# Patient Record
Sex: Female | Born: 1995
Health system: Southern US, Community
[De-identification: ages and names within clinical notes are randomized; demographics above are authoritative.]

---
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
## 2017-02-14 ENCOUNTER — Emergency Department: Payer: 59

## 2017-02-14 ENCOUNTER — Emergency Department
Admission: EM | Admit: 2017-02-14 | Discharge: 2017-02-14 | Disposition: A | Discharge status: Home / Self Care | Payer: 59 | Attending: Emergency Medicine | Admitting: Emergency Medicine

## 2017-02-14 ENCOUNTER — Encounter: Payer: Self-pay | Admitting: Emergency Medicine

## 2017-02-14 DIAGNOSIS — R1030 Lower abdominal pain, unspecified: Secondary | ICD-10-CM

## 2017-02-14 DIAGNOSIS — R109 Unspecified abdominal pain: Secondary | ICD-10-CM

## 2017-02-14 DIAGNOSIS — N72 Inflammatory disease of cervix uteri: Secondary | ICD-10-CM | POA: Insufficient documentation

## 2017-02-14 DIAGNOSIS — R1031 Right lower quadrant pain: Secondary | ICD-10-CM | POA: Diagnosis present

## 2017-02-14 LAB — URINALYSIS, COMPLETE (UACMP) WITH MICROSCOPIC
Bacteria, UA: NONE SEEN
Bilirubin Urine: NEGATIVE
GLUCOSE, UA: NEGATIVE mg/dL
HGB URINE DIPSTICK: NEGATIVE
Ketones, ur: NEGATIVE mg/dL
NITRITE: NEGATIVE
Protein, ur: NEGATIVE mg/dL
SPECIFIC GRAVITY, URINE: 1.019 (ref 1.005–1.030)
pH: 6 (ref 5.0–8.0)

## 2017-02-14 LAB — COMPREHENSIVE METABOLIC PANEL
ALBUMIN: 4.9 g/dL (ref 3.5–5.0)
ALT: 12 U/L — ABNORMAL LOW (ref 14–54)
ANION GAP: 10 (ref 5–15)
AST: 20 U/L (ref 15–41)
Alkaline Phosphatase: 57 U/L (ref 38–126)
BUN: 9 mg/dL (ref 6–20)
CHLORIDE: 105 mmol/L (ref 101–111)
CO2: 24 mmol/L (ref 22–32)
Calcium: 9.6 mg/dL (ref 8.9–10.3)
Creatinine, Ser: 0.67 mg/dL (ref 0.44–1.00)
GFR calc Af Amer: >60 mL/min (ref >60)
GFR calc non Af Amer: >60 mL/min (ref >60)
GLUCOSE: 86 mg/dL (ref 65–99)
POTASSIUM: 3.3 mmol/L — AB (ref 3.5–5.1)
Sodium: 139 mmol/L (ref 135–145)
Total Bilirubin: 2.4 mg/dL — ABNORMAL HIGH (ref 0.3–1.2)
Total Protein: 8.4 g/dL — ABNORMAL HIGH (ref 6.5–8.1)

## 2017-02-14 LAB — CBC
HCT: 39.3 % (ref 35.0–47.0)
HEMOGLOBIN: 13.5 g/dL (ref 12.0–16.0)
MCH: 30.9 pg (ref 26.0–34.0)
MCHC: 34.3 g/dL (ref 32.0–36.0)
MCV: 90.2 fL (ref 80.0–100.0)
Platelets: 260 10*3/uL (ref 150–440)
RBC: 4.36 MIL/uL (ref 3.80–5.20)
RDW: 12.9 % (ref 11.5–14.5)
WBC: 12.9 10*3/uL — AB (ref 3.6–11.0)

## 2017-02-14 LAB — LIPASE, BLOOD: LIPASE: 21 U/L (ref 11–51)

## 2017-02-14 LAB — WET PREP, GENITAL
CLUE CELLS WET PREP: NONE SEEN
SPERM: NONE SEEN
Trich, Wet Prep: NONE SEEN
Yeast Wet Prep HPF POC: NONE SEEN

## 2017-02-14 LAB — CHLAMYDIA/NGC RT PCR (ARMC ONLY)
CHLAMYDIA TR: NOT DETECTED
N gonorrhoeae: NOT DETECTED

## 2017-02-14 LAB — PREGNANCY, URINE: Preg Test, Ur: NEGATIVE

## 2017-02-14 MED ORDER — TRAMADOL HCL 50 MG PO TABS: 50.0000 mg | ORAL_TABLET | Freq: Four times a day (QID) | ORAL | 0 refills | Status: DC | PRN

## 2017-02-14 MED ORDER — DOXYCYCLINE HYCLATE 100 MG PO TABS: 100.0000 mg | ORAL_TABLET | Freq: Two times a day (BID) | ORAL | 0 refills | Status: DC

## 2017-02-14 MED ORDER — CEFTRIAXONE SODIUM 250 MG IJ SOLR
250.0000 mg | Freq: Once | INTRAMUSCULAR | Status: AC
  Administered 2017-02-14: 250 mg via INTRAMUSCULAR
  Filled 2017-02-14: qty 250

## 2017-02-14 MED ORDER — ONDANSETRON 4 MG PO TBDP
4.0000 mg | ORAL_TABLET | Freq: Once | ORAL | Status: AC
  Administered 2017-02-14: 4 mg via ORAL
  Filled 2017-02-14: qty 1

## 2017-02-14 MED ORDER — OXYCODONE-ACETAMINOPHEN 5-325 MG PO TABS
1.0000 | ORAL_TABLET | Freq: Once | ORAL | Status: AC
  Administered 2017-02-14: 1 via ORAL
  Filled 2017-02-14: qty 1

## 2017-02-14 MED ORDER — DOXYCYCLINE HYCLATE 100 MG PO TABS
100.0000 mg | ORAL_TABLET | Freq: Once | ORAL | Status: AC
  Administered 2017-02-14: 100 mg via ORAL
  Filled 2017-02-14: qty 1

## 2017-02-14 NOTE — ED Notes (Signed)
Report from iris, rn. 

## 2017-02-14 NOTE — ED Provider Notes (Signed)
[facility] Emergency Department Provider Note   ____________________________________________   First MD Initiated Contact with Patient 02/14/17 [phone]     (approximate)  I have reviewed the triage vital signs and the nursing notes.   HISTORY  Chief Complaint Abdominal Pain    HPI Virginia Hicks is a 21 y.o. female who comes into the hospital today with abdominal pain. The patient reports that the pain started today. She states it is in her lower abdomen but she also has some mild pain in her upper abdomen. The patient has not taken anything for pain and rates her pain a 7 out of 10 in intensity currently. She reports that she has no pain with urination but she does have some vaginal discharge. She reports it is whitish with no odor. The patient's last menstrual period was around March 15. The patient has had some nausea with no vomiting and some mild diarrhea. She's never had these symptoms before and denies any fevers at home. The patient is here today for evaluation of her symptoms.   History reviewed. No pertinent past medical history.  There are no active problems to display for this patient.   History reviewed. No pertinent surgical history.  Prior to Admission medications   Medication Sig Start Date End Date Taking? Authorizing Provider  doxycycline (VIBRA-TABS) 100 MG tablet Take 1 tablet (100 mg total) by mouth 2 (two) times daily. 02/14/17   Rebecka Apley, MD  traMADol (ULTRAM) 50 MG tablet Take 1 tablet (50 mg total) by mouth every 6 (six) hours as needed. 02/14/17   Rebecka Apley, MD    Allergies Patient has no known allergies.  History reviewed. No pertinent family history.  Social History Social History  Substance Use Topics  . Smoking status: Never Smoker  . Smokeless tobacco: Never Used  . Alcohol use No    Review of Systems Constitutional: No fever/chills Eyes: No visual changes. ENT: No sore throat. Cardiovascular:  Denies chest pain. Respiratory: Denies shortness of breath. Gastrointestinal:  abdominal pain,  nausea, and diarrhea, no vomiting. No constipation. Genitourinary: Negative for dysuria. Musculoskeletal: Negative for back pain. Skin: Negative for rash. Neurological: Negative for headaches, focal weakness or numbness.  10-point ROS otherwise negative.  ____________________________________________   PHYSICAL EXAM:  VITAL SIGNS: ED Triage Vitals  Enc Vitals Group     BP 02/14/17 0143 133/83     Pulse Rate 02/14/17 0143 77     Resp 02/14/17 0143 18     Temp 02/14/17 0143 98.4 F (36.9 C)     Temp Source 02/14/17 0143 Oral     SpO2 02/14/17 0143 100 %     Weight 02/14/17 0144 112 lb (50.8 kg)     Height 02/14/17 0144  (1.727 m)     Head Circumference --      Peak Flow --      Pain Score 02/14/17 0142 7     Pain Loc --      Pain Edu? --      Excl. in GC? --     Constitutional: Alert and oriented. Well appearing and in moderate distress. Eyes: Conjunctivae are normal. PERRL. EOMI. Head: Atraumatic. Nose: No congestion/rhinnorhea. Mouth/Throat: Mucous membranes are moist.  Oropharynx non-erythematous. Cardiovascular: Normal rate, regular rhythm. Grossly normal heart sounds.  Good peripheral circulation. Respiratory: Normal respiratory effort.  No retractions. Lungs CTAB. Gastrointestinal: Soft with some RLQ abd pain, No distention. Positive bowel sounds Genitourinary: Normal external genitalia, cervical motion tenderness  with some right-sided and uterine tenderness to palpation. Musculoskeletal: No lower extremity tenderness nor edema.   Neurologic:  Normal speech and language.  Skin:  Skin is warm, dry and intact.  Psychiatric: Mood and affect are normal.   ____________________________________________   LABS (all labs ordered are listed, but only abnormal results are displayed)  Labs Reviewed  WET PREP, GENITAL - Abnormal; Notable for the following:       Result  Value   WBC, Wet Prep HPF POC MANY (*)    All other components within normal limits  COMPREHENSIVE METABOLIC PANEL - Abnormal; Notable for the following:    Potassium 3.3 (*)    Total Protein 8.4 (*)    ALT 12 (*)    Total Bilirubin 2.4 (*)    All other components within normal limits  CBC - Abnormal; Notable for the following:    WBC 12.9 (*)    All other components within normal limits  URINALYSIS, COMPLETE (UACMP) WITH MICROSCOPIC - Abnormal; Notable for the following:    Color, Urine YELLOW (*)    APPearance HAZY (*)    Leukocytes, UA SMALL (*)    Squamous Epithelial / LPF 0-5 (*)    All other components within normal limits  CHLAMYDIA/NGC RT PCR ([facility] ONLY)  LIPASE, BLOOD  PREGNANCY, URINE  POC URINE PREG, ED   ____________________________________________  EKG  none ____________________________________________  RADIOLOGY  US pelvis  ____________________________________________   PROCEDURES  Procedure(s) performed: None  Procedures  Critical Care performed: No  ____________________________________________   INITIAL IMPRESSION / ASSESSMENT AND PLAN / ED COURSE  Pertinent labs & imaging results that were available during my care of the patient were reviewed by me and considered in my medical decision making (see chart for details).  This is a 21 year old who comes into the hospital today with abdominal pain. I will perform a pelvic exam as well as in the patient for an ultrasound to look at her ovaries. I will give the patient a dose of Percocet and I will reassess the patient.  Clinical Course as of Feb 15 632  Mon Feb 14, 2017  0604 Normal examination. No evidence of abnormal ovarian mass or torsion. US Pelvis Complete [AW]    Clinical Course User Index [AW] Rebecka Apley, MD   The patient has been resting comfortably in her room. She received her ultrasound it was negative. The patient though did have some cervical motion tenderness on  exam. I feel that the patient may have some cervicitis causing some of her discomfort. I will give the patient some ceftriaxone and doxycycline. I did discuss with the patient that we did not evaluate her appendix but she has not had any fevers, vomiting, worsening pain, or decreased appetite. I informed the patient that should the pain come back should be persistent over the next 24-48 hours she should return to the emergency department for evaluation of her appendix. The patient reports that she understands. She'll be discharged home.  ____________________________________________   FINAL CLINICAL IMPRESSION(S) / ED DIAGNOSES  Final diagnoses:  Abdominal pain  Lower abdominal pain  Cervicitis      NEW MEDICATIONS STARTED DURING THIS VISIT:  New Prescriptions   DOXYCYCLINE (VIBRA-TABS) 100 MG TABLET    Take 1 tablet (100 mg total) by mouth 2 (two) times daily.   TRAMADOL (ULTRAM) 50 MG TABLET    Take 1 tablet (50 mg total) by mouth every 6 (six) hours as needed.     Note:  This document was prepared using Dragon voice recognition software and may include unintentional dictation errors.    Rebecka Apley, MD 02/14/17 [phone]

## 2017-02-14 NOTE — ED Triage Notes (Signed)
Pt with father reports pain lower umbilicus since 1200 yesterday, sharp 7/10 pain with N/D and chills.  Pt denies dysuria but reports frequency.  Pt reports family history of ovarian cysts and irregular periods.

## 2017-02-14 NOTE — Discharge Instructions (Signed)
Please return with persistent or worsening pain. We did not evaluate your appendix at this visit but should the pain persist I would be concerned about her appendix. Otherwise please follow-up with the acute care clinic.

## 2017-02-15 ENCOUNTER — Encounter: Payer: Self-pay | Admitting: *Deleted

## 2017-02-15 DIAGNOSIS — R110 Nausea: Secondary | ICD-10-CM | POA: Insufficient documentation

## 2017-02-15 DIAGNOSIS — R1013 Epigastric pain: Secondary | ICD-10-CM | POA: Diagnosis not present

## 2017-02-15 DIAGNOSIS — R1011 Right upper quadrant pain: Secondary | ICD-10-CM | POA: Diagnosis present

## 2017-02-15 DIAGNOSIS — R1031 Right lower quadrant pain: Secondary | ICD-10-CM | POA: Diagnosis not present

## 2017-02-15 LAB — URINALYSIS, COMPLETE (UACMP) WITH MICROSCOPIC
BACTERIA UA: NONE SEEN
Bilirubin Urine: NEGATIVE
Glucose, UA: NEGATIVE mg/dL
Hgb urine dipstick: NEGATIVE
Ketones, ur: NEGATIVE mg/dL
Leukocytes, UA: NEGATIVE
NITRITE: NEGATIVE
Protein, ur: NEGATIVE mg/dL
SPECIFIC GRAVITY, URINE: 1.013 (ref 1.005–1.030)
pH: 5 (ref 5.0–8.0)

## 2017-02-15 LAB — COMPREHENSIVE METABOLIC PANEL
ALBUMIN: 4.1 g/dL (ref 3.5–5.0)
ALK PHOS: 50 U/L (ref 38–126)
ALT: 11 U/L — ABNORMAL LOW (ref 14–54)
ANION GAP: 6 (ref 5–15)
AST: 18 U/L (ref 15–41)
BUN: 9 mg/dL (ref 6–20)
CALCIUM: 8.8 mg/dL — AB (ref 8.9–10.3)
CO2: 26 mmol/L (ref 22–32)
Chloride: 104 mmol/L (ref 101–111)
Creatinine, Ser: 0.64 mg/dL (ref 0.44–1.00)
GFR calc Af Amer: >60 mL/min (ref >60)
GFR calc non Af Amer: >60 mL/min (ref >60)
GLUCOSE: 104 mg/dL — AB (ref 65–99)
Potassium: 4 mmol/L (ref 3.5–5.1)
SODIUM: 136 mmol/L (ref 135–145)
Total Bilirubin: 1.9 mg/dL — ABNORMAL HIGH (ref 0.3–1.2)
Total Protein: 7.1 g/dL (ref 6.5–8.1)

## 2017-02-15 LAB — CBC
HEMATOCRIT: 37.1 % (ref 35.0–47.0)
Hemoglobin: 12.6 g/dL (ref 12.0–16.0)
MCH: 31.2 pg (ref 26.0–34.0)
MCHC: 34.1 g/dL (ref 32.0–36.0)
MCV: 91.6 fL (ref 80.0–100.0)
Platelets: 243 10*3/uL (ref 150–440)
RBC: 4.05 MIL/uL (ref 3.80–5.20)
RDW: 12.8 % (ref 11.5–14.5)
WBC: 7.3 10*3/uL (ref 3.6–11.0)

## 2017-02-15 LAB — LIPASE, BLOOD: Lipase: 24 U/L (ref 11–51)

## 2017-02-15 LAB — POCT PREGNANCY, URINE: PREG TEST UR: NEGATIVE

## 2017-02-15 NOTE — ED Triage Notes (Signed)
Pt seen here on the 16th for abdominal pain, states pain (around the umbilicus, upper abdomen, and intermittent lower abdomen) still persist. Told to come back for eval if still ongoing. Pain meds "only dull the pain" slightly.

## 2017-02-15 NOTE — ED Triage Notes (Signed)
Denies any new symptoms

## 2017-02-16 ENCOUNTER — Ambulatory Visit (INDEPENDENT_AMBULATORY_CARE_PROVIDER_SITE_OTHER): Payer: 59 | Admitting: Gastroenterology

## 2017-02-16 ENCOUNTER — Encounter: Payer: Self-pay | Admitting: Radiology

## 2017-02-16 ENCOUNTER — Emergency Department: Payer: 59

## 2017-02-16 ENCOUNTER — Emergency Department
Admission: EM | Admit: 2017-02-16 | Discharge: 2017-02-16 | Disposition: A | Discharge status: Home / Self Care | Payer: 59 | Attending: Emergency Medicine | Admitting: Emergency Medicine

## 2017-02-16 ENCOUNTER — Encounter: Payer: Self-pay | Admitting: Gastroenterology

## 2017-02-16 VITALS — BP 99/66 | HR 68 | Temp 98.7°F | Resp 16 | Ht 68.0 in | Wt 108.0 lb

## 2017-02-16 DIAGNOSIS — R1013 Epigastric pain: Secondary | ICD-10-CM

## 2017-02-16 LAB — PREGNANCY, URINE: Preg Test, Ur: NEGATIVE

## 2017-02-16 MED ORDER — DICYCLOMINE HCL 10 MG PO CAPS: 10.0000 mg | ORAL_CAPSULE | Freq: Three times a day (TID) | ORAL | 0 refills | Status: AC

## 2017-02-16 MED ORDER — PANTOPRAZOLE SODIUM 40 MG PO TBEC: 40.0000 mg | DELAYED_RELEASE_TABLET | Freq: Every day | ORAL | 0 refills | Status: AC

## 2017-02-16 MED ORDER — IOPAMIDOL (ISOVUE-300) INJECTION 61%
30.0000 mL | INTRAVENOUS | Status: AC
Start: 2017-02-16 — End: 2017-02-16
  Administered 2017-02-16 (×2): 15 mL via ORAL

## 2017-02-16 MED ORDER — IOPAMIDOL (ISOVUE-300) INJECTION 61%
75.0000 mL | Freq: Once | INTRAVENOUS | Status: AC | PRN
  Administered 2017-02-16: 75 mL via INTRAVENOUS

## 2017-02-16 NOTE — ED Notes (Signed)
Patient finished with oral contrast. CT dept notified. No other needs at this time.

## 2017-02-16 NOTE — ED Notes (Addendum)
Patient ambulatory to triage with steady gait, without difficulty or distress noted; pt updated on wait time--voices good understanding; vs retaken

## 2017-02-16 NOTE — ED Provider Notes (Signed)
[facility] Emergency Department Provider Note    First MD Initiated Contact with Patient 02/16/17 0138     (approximate)  I have reviewed the triage vital signs and the nursing notes.   HISTORY  Chief Complaint Abdominal Pain   HPI Virginia Hicks is a 21 y.o. female presents with epigastric/right upper quadrant abdominal pain accompanied by nausea. Patient denies any fever no vomiting no diarrhea no urinary symptoms no constipation. Patient states that symptoms are worse after eating. Of note patient was seen in the emergency department on 02/14/2017 at which time pelvic ultrasounds revealed no gross abnormality. Of note patient's bilirubin noted to be elevated 2.4 during previous visit bilirubin 1.9 today.   Past medical history None There are no active problems to display for this patient.   Past surgical history None  Prior to Admission medications   Medication Sig Start Date End Date Taking? Authorizing Provider  dicyclomine (BENTYL) 10 MG capsule Take 1 capsule (10 mg total) by mouth 4 (four) times daily -  before meals and at bedtime. 02/16/17   Wyline Mood, MD  doxycycline (VIBRA-TABS) 100 MG tablet Take 1 tablet (100 mg total) by mouth 2 (two) times daily. Patient not taking: Reported on 02/16/2017 02/14/17   Rebecka Apley, MD  pantoprazole (PROTONIX) 40 MG tablet Take 1 tablet (40 mg total) by mouth daily. 02/16/17 02/16/18  Darci Current, MD  traMADol (ULTRAM) 50 MG tablet Take 1 tablet (50 mg total) by mouth every 6 (six) hours as needed. Patient not taking: Reported on 02/16/2017 02/14/17   Rebecka Apley, MD    Allergies No known drug allergies Family history Gallstones   Social History Social History  Substance Use Topics  . Smoking status: Never Smoker  . Smokeless tobacco: Never Used     Comment: vapors  . Alcohol use Yes     Comment: occasional    Review of Systems Constitutional: No fever/chills Eyes: No visual  changes. ENT: No sore throat. Cardiovascular: Denies chest pain. Respiratory: Denies shortness of breath. Gastrointestinal: Positive for abdominal pain and nausea  Genitourinary: Negative for dysuria. Musculoskeletal: Negative for back pain. Skin: Negative for rash. Neurological: Negative for headaches, focal weakness or numbness.  10-point ROS otherwise negative.  ____________________________________________   PHYSICAL EXAM:  VITAL SIGNS: ED Triage Vitals  Enc Vitals Group     BP 02/15/17 2142 117/70     Pulse Rate 02/15/17 2142 69     Resp 02/15/17 2142 16     Temp 02/15/17 2142 98.5 F (36.9 C)     Temp Source 02/15/17 2142 Oral     SpO2 02/15/17 2142 100 %     Weight 02/15/17 2143 112 lb (50.8 kg)     Height 02/15/17 2143  (1.727 m)     Head Circumference --      Peak Flow --      Pain Score 02/15/17 2142 4     Pain Loc --      Pain Edu? --      Excl. in GC? --     Constitutional: Alert and oriented. Well appearing and in no acute distress. Eyes: Conjunctivae are normal. PERRL. EOMI. Head: Atraumatic. Mouth/Throat: Mucous membranes are moist.  Oropharynx non-erythematous. Neck: No stridor.   Cardiovascular: Normal rate, regular rhythm. Good peripheral circulation. Grossly normal heart sounds. Respiratory: Normal respiratory effort.  No retractions. Lungs CTAB. Gastrointestinal: Right upper quadrant/right lower quadrant tenderness to palpation. No distention.  Musculoskeletal: No lower  extremity tenderness nor edema. No gross deformities of extremities. Neurologic:  Normal speech and language. No gross focal neurologic deficits are appreciated.  Skin:  Skin is warm, dry and intact. No rash noted. Psychiatric: Mood and affect are normal. Speech and behavior are normal.  ____________________________________________   LABS (all labs ordered are listed, but only abnormal results are displayed)  Labs Reviewed  COMPREHENSIVE METABOLIC PANEL - Abnormal;  Notable for the following:       Result Value   Glucose, Bld 104 (*)    Calcium 8.8 (*)    ALT 11 (*)    Total Bilirubin 1.9 (*)    All other components within normal limits  URINALYSIS, COMPLETE (UACMP) WITH MICROSCOPIC - Abnormal; Notable for the following:    Color, Urine YELLOW (*)    APPearance HAZY (*)    Squamous Epithelial / LPF 6-30 (*)    All other components within normal limits  LIPASE, BLOOD  CBC  PREGNANCY, URINE  POC URINE PREG, ED  POCT PREGNANCY, URINE    RADIOLOGY I, [place] N BROWN, personally viewed and evaluated these images (plain radiographs) as part of my medical decision making, as well as reviewing the written report by the radiologist.  Ct Abdomen Pelvis W Contrast  Result Date: 02/16/2017 CLINICAL DATA:  Upper abdominal, umbilical, and lower abdominal pain. Patient was seen here on the sixteenth for same and told to return if pain still ongoing. Minimal response to pain medication. EXAM: CT ABDOMEN AND PELVIS WITH CONTRAST TECHNIQUE: Multidetector CT imaging of the abdomen and pelvis was performed using the standard protocol following bolus administration of intravenous contrast. CONTRAST:  75mL ISOVUE-300 IOPAMIDOL (ISOVUE-300) INJECTION 61% COMPARISON:  None. FINDINGS: Lower chest: The lung bases are clear. Hepatobiliary: No focal liver abnormality is seen. No gallstones, gallbladder wall thickening, or biliary dilatation. Pancreas: Unremarkable. No pancreatic ductal dilatation or surrounding inflammatory changes. Spleen: Normal in size without focal abnormality. Adrenals/Urinary Tract: Adrenal glands are unremarkable. Kidneys are normal, without renal calculi, focal lesion, or hydronephrosis. Bladder is unremarkable. Stomach/Bowel: Stomach is within normal limits. Appendix appears normal. No evidence of bowel wall thickening, distention, or inflammatory changes. Vascular/Lymphatic: No significant vascular findings are present. No enlarged abdominal or pelvic  lymph nodes. Reproductive: Uterus and bilateral adnexa are unremarkable. Other: No abdominal wall hernia or abnormality. No abdominopelvic ascites. Musculoskeletal: No destructive bone lesions. IMPRESSION: No acute process demonstrated in the abdomen or pelvis. No evidence of bowel obstruction or inflammation. Electronically Signed   By: Burman Nieves M.D.   On: 02/16/2017 04:34      Procedures   ____________________________________________   INITIAL IMPRESSION / ASSESSMENT AND PLAN / ED COURSE  Pertinent labs & imaging results that were available during my care of the patient were reviewed by me and considered in my medical decision making (see chart for details).  History of physical exam concerning for possible gallbladder pathology versus gastric ulcer disease. CT scan of the abdomen and pelvis performed secondary to additional right lower quadrant pain. No acute process was noted on the CT scan and a such patient will be referred to gastroenterology for further outpatient evaluation.      ____________________________________________  FINAL CLINICAL IMPRESSION(S) / ED DIAGNOSES  Abdominal pain  MEDICATIONS GIVEN DURING THIS VISIT:  Medications  iopamidol (ISOVUE-300) 61 % injection 30 mL (15 mLs Oral Contrast Given 02/16/17 0300)  iopamidol (ISOVUE-300) 61 % injection 75 mL (75 mLs Intravenous Contrast Given 02/16/17 0412)     NEW OUTPATIENT MEDICATIONS STARTED  DURING THIS VISIT:  Discharge Medication List as of 02/16/2017  5:53 AM    START taking these medications   Details  pantoprazole (PROTONIX) 40 MG tablet Take 1 tablet (40 mg total) by mouth daily., Starting Wed 02/16/2017, Until Thu 02/16/2018, Print        Discharge Medication List as of 02/16/2017  5:53 AM      Discharge Medication List as of 02/16/2017  5:53 AM       Note:  This document was prepared using Dragon voice recognition software and may include unintentional dictation errors.    Darci Current, MD 02/16/17 [phone]

## 2017-02-16 NOTE — Progress Notes (Signed)
Gastroenterology Consultation  Referring Provider: Dr Manson Passey    Primary Care Physician:  No PCP Per Patient Primary Gastroenterologist:  Dr. Wyline Mood  Reason for Consultation:     Abdominal pain         HPI:   Virginia Hicks is a 21 y.o. y/o female referred for consultation & management  by Dr. Bonnetta Barry PCP Per Patient.    She has been referred for epigastric pain. She was seen at the ER on 02/14/17 for lower and upper abdominal pain of 1 day duration . At the same time she did have some vaginal discharge. She was treated for what it appears PID with ceftriaxone and doxycycline. Dopplers evaluation of the ovaries were normal , she had a normal TV USG and pelvic USG.   She returned this morning to the ER with abdominal pain  Ct scan of the abdomen on 4 /18/2018-normal    Abdominal pain: Onset: began on Sunday - 4 days back . "little bit better", still hurts, on and off, occurs every every few minutes, pain lasts from minutes to few hours  Site :Center of the abdomen , some in the lower abdomen, since starting antibiotics the pain is better  Radiation: localized  Severity :presently 4/10  Ashby Dawes of pain: sharp  Aggravating factors: moving , eating and drinking , while she is eating  Relieving factors :sitting for a few minutes  Weight loss: yes a few lbs  NSAID use: none  PPI use :not yet on a PPI  Gall bladder surgery: still intact  Frequency of bowel movements: not every , once every 2-3 days, not hard  Change in bowel movements: none  Relief with bowel movements: no  Gas/Bloating/Abdominal distension: not much.   Still has vaginal discharge. No fevers   Smokes - uses a vape- many times a day , uses marijuana once a week .    No past medical history on file.  No past surgical history on file.  Prior to Admission medications   Medication Sig Start Date End Date Taking? Authorizing Provider  doxycycline (VIBRA-TABS) 100 MG tablet Take 1 tablet (100 mg total) by mouth 2 (two)  times daily. 02/14/17   Rebecka Apley, MD  pantoprazole (PROTONIX) 40 MG tablet Take 1 tablet (40 mg total) by mouth daily. 02/16/17 02/16/18  Darci Current, MD  traMADol (ULTRAM) 50 MG tablet Take 1 tablet (50 mg total) by mouth every 6 (six) hours as needed. 02/14/17   Rebecka Apley, MD    No family history on file.   Social History  Substance Use Topics  . Smoking status: Never Smoker  . Smokeless tobacco: Never Used  . Alcohol use No    Allergies as of 02/16/2017  . (No Known Allergies)    Review of Systems:    All systems reviewed and negative except where noted in HPI.   Physical Exam:  BP 99/66   Pulse 68   Temp 98.7 F (37.1 C)   Resp 16   Ht  (1.727 m)   Wt 108 lb (49 kg)   BMI 16.42 kg/m  No LMP recorded. Patient is not currently having periods (Reason: Irregular Periods). Psych:  Alert and cooperative. Normal mood and affect. General:   Alert,  Well-developed, well-nourished,thin in stature  pleasant and cooperative in NAD Head:  Normocephalic and atraumatic. Eyes:  Sclera clear, no icterus.   Conjunctiva pink. Ears:  Normal auditory acuity. Nose:  No deformity, discharge, or lesions. Mouth:  No deformity or lesions,oropharynx pink & moist. Neck:  Supple; no masses or thyromegaly. Lungs:  Respirations even and unlabored.  Clear throughout to auscultation.   No wheezes, crackles, or rhonchi. No acute distress. Heart:  Regular rate and rhythm; no murmurs, clicks, rubs, or gallops. Abdomen:  Normal bowel sounds.  No bruits.  Soft, non-tender and non-distended without masses, hepatosplenomegaly or hernias noted.  No guarding or rebound tenderness.    Msk:  Symmetrical without gross deformities. Good, equal movement & strength bilaterally. Neurologic:  Alert and oriented x3;  grossly normal neurologically. Skin:  Intact without significant lesions or rashes. No jaundice. Psych:  Alert and cooperative. Normal mood and affect.  Imaging Studies: US  Transvaginal Non-ob  Result Date: 02/14/2017 CLINICAL DATA:  Lower abdominal pain for 1 day EXAM: TRANSABDOMINAL AND TRANSVAGINAL ULTRASOUND OF PELVIS DOPPLER ULTRASOUND OF OVARIES TECHNIQUE: Both transabdominal and transvaginal ultrasound examinations of the pelvis were performed. Transabdominal technique was performed for global imaging of the pelvis including uterus, ovaries, adnexal regions, and pelvic cul-de-sac. It was necessary to proceed with endovaginal exam following the transabdominal exam to visualize the ovaries and endometrium. Color and duplex Doppler ultrasound was utilized to evaluate blood flow to the ovaries. COMPARISON:  None. FINDINGS: Uterus Measurements: 8 x 3.6 x 5.3 cm. Uterus is anteverted. No fibroids or other mass visualized. Endometrium Thickness: 8.3 mm.  No focal abnormality visualized. Right ovary Measurements: 5.1 x 2.4 x 2.5 cm. Hemorrhagic corpus luteum cyst demonstrated measuring 2.5 cm maximal diameter. No abnormal adnexal mass. Left ovary Measurements: 2.9 x 1.7 x 3 cm. Normal appearance/no adnexal mass. Pulsed Doppler evaluation of both ovaries demonstrates normal low-resistance arterial and venous waveforms. Other findings No abnormal free fluid. IMPRESSION: Normal examination. No evidence of abnormal ovarian mass or torsion. Electronically Signed   By: Burman Nieves M.D.   On: 02/14/2017 05:56   US Pelvis Complete  Result Date: 02/14/2017 CLINICAL DATA:  Lower abdominal pain for 1 day EXAM: TRANSABDOMINAL AND TRANSVAGINAL ULTRASOUND OF PELVIS DOPPLER ULTRASOUND OF OVARIES TECHNIQUE: Both transabdominal and transvaginal ultrasound examinations of the pelvis were performed. Transabdominal technique was performed for global imaging of the pelvis including uterus, ovaries, adnexal regions, and pelvic cul-de-sac. It was necessary to proceed with endovaginal exam following the transabdominal exam to visualize the ovaries and endometrium. Color and duplex Doppler ultrasound  was utilized to evaluate blood flow to the ovaries. COMPARISON:  None. FINDINGS: Uterus Measurements: 8 x 3.6 x 5.3 cm. Uterus is anteverted. No fibroids or other mass visualized. Endometrium Thickness: 8.3 mm.  No focal abnormality visualized. Right ovary Measurements: 5.1 x 2.4 x 2.5 cm. Hemorrhagic corpus luteum cyst demonstrated measuring 2.5 cm maximal diameter. No abnormal adnexal mass. Left ovary Measurements: 2.9 x 1.7 x 3 cm. Normal appearance/no adnexal mass. Pulsed Doppler evaluation of both ovaries demonstrates normal low-resistance arterial and venous waveforms. Other findings No abnormal free fluid. IMPRESSION: Normal examination. No evidence of abnormal ovarian mass or torsion. Electronically Signed   By: Burman Nieves M.D.   On: 02/14/2017 05:56   Ct Abdomen Pelvis W Contrast  Result Date: 02/16/2017 CLINICAL DATA:  Upper abdominal, umbilical, and lower abdominal pain. Patient was seen here on the sixteenth for same and told to return if pain still ongoing. Minimal response to pain medication. EXAM: CT ABDOMEN AND PELVIS WITH CONTRAST TECHNIQUE: Multidetector CT imaging of the abdomen and pelvis was performed using the standard protocol following bolus administration of intravenous contrast. CONTRAST:  75mL ISOVUE-300 IOPAMIDOL (ISOVUE-300) INJECTION 61%  COMPARISON:  None. FINDINGS: Lower chest: The lung bases are clear. Hepatobiliary: No focal liver abnormality is seen. No gallstones, gallbladder wall thickening, or biliary dilatation. Pancreas: Unremarkable. No pancreatic ductal dilatation or surrounding inflammatory changes. Spleen: Normal in size without focal abnormality. Adrenals/Urinary Tract: Adrenal glands are unremarkable. Kidneys are normal, without renal calculi, focal lesion, or hydronephrosis. Bladder is unremarkable. Stomach/Bowel: Stomach is within normal limits. Appendix appears normal. No evidence of bowel wall thickening, distention, or inflammatory changes.  Vascular/Lymphatic: No significant vascular findings are present. No enlarged abdominal or pelvic lymph nodes. Reproductive: Uterus and bilateral adnexa are unremarkable. Other: No abdominal wall hernia or abnormality. No abdominopelvic ascites. Musculoskeletal: No destructive bone lesions. IMPRESSION: No acute process demonstrated in the abdomen or pelvis. No evidence of bowel obstruction or inflammation. Electronically Signed   By: Burman Nieves M.D.   On: 02/16/2017 04:34   [place] Art/ven Flow Abd Pelv Doppler  Result Date: 02/14/2017 CLINICAL DATA:  Lower abdominal pain for 1 day EXAM: TRANSABDOMINAL AND TRANSVAGINAL ULTRASOUND OF PELVIS DOPPLER ULTRASOUND OF OVARIES TECHNIQUE: Both transabdominal and transvaginal ultrasound examinations of the pelvis were performed. Transabdominal technique was performed for global imaging of the pelvis including uterus, ovaries, adnexal regions, and pelvic cul-de-sac. It was necessary to proceed with endovaginal exam following the transabdominal exam to visualize the ovaries and endometrium. Color and duplex Doppler ultrasound was utilized to evaluate blood flow to the ovaries. COMPARISON:  None. FINDINGS: Uterus Measurements: 8 x 3.6 x 5.3 cm. Uterus is anteverted. No fibroids or other mass visualized. Endometrium Thickness: 8.3 mm.  No focal abnormality visualized. Right ovary Measurements: 5.1 x 2.4 x 2.5 cm. Hemorrhagic corpus luteum cyst demonstrated measuring 2.5 cm maximal diameter. No abnormal adnexal mass. Left ovary Measurements: 2.9 x 1.7 x 3 cm. Normal appearance/no adnexal mass. Pulsed Doppler evaluation of both ovaries demonstrates normal low-resistance arterial and venous waveforms. Other findings No abnormal free fluid. IMPRESSION: Normal examination. No evidence of abnormal ovarian mass or torsion. Electronically Signed   By: Burman Nieves M.D.   On: 02/14/2017 05:56    Assessment and Plan:   Virginia Hicks is a 21 y.o. y/o female has been referred  for abdominal pain of  Days duration. Presently on antibiotics for PID which she says is making her feel a bit better, She smokes vape and marijuana too . I suggested her to complete the course of antibiotics, commence on PPI and use bentyl as needed . If no better in 7-10 days then will plan to . Scan gall bladder and consider endoscopy if no better. I also strongly suggested to stop smoking marijuana and vape.    Follow up PRN  Dr Wyline Mood MD

## 2017-02-16 NOTE — ED Notes (Signed)
Patient returned from CT

## 2018-03-28 ENCOUNTER — Encounter: Payer: Self-pay | Admitting: Emergency Medicine

## 2018-03-28 ENCOUNTER — Other Ambulatory Visit: Payer: Self-pay

## 2018-03-28 ENCOUNTER — Emergency Department
Admission: EM | Admit: 2018-03-28 | Discharge: 2018-03-28 | Disposition: A | Discharge status: Home / Self Care | Payer: Managed Care, Other (non HMO) | Attending: Emergency Medicine | Admitting: Emergency Medicine

## 2018-03-28 ENCOUNTER — Emergency Department: Payer: Managed Care, Other (non HMO)

## 2018-03-28 DIAGNOSIS — R55 Syncope and collapse: Secondary | ICD-10-CM | POA: Diagnosis not present

## 2018-03-28 DIAGNOSIS — Z79899 Other long term (current) drug therapy: Secondary | ICD-10-CM | POA: Diagnosis not present

## 2018-03-28 DIAGNOSIS — N739 Female pelvic inflammatory disease, unspecified: Secondary | ICD-10-CM | POA: Diagnosis not present

## 2018-03-28 DIAGNOSIS — A749 Chlamydial infection, unspecified: Secondary | ICD-10-CM

## 2018-03-28 LAB — COMPREHENSIVE METABOLIC PANEL
ALK PHOS: 58 U/L (ref 38–126)
ALT: 15 U/L (ref 14–54)
ANION GAP: 11 (ref 5–15)
AST: 23 U/L (ref 15–41)
Albumin: 4 g/dL (ref 3.5–5.0)
BILIRUBIN TOTAL: 2.1 mg/dL — AB (ref 0.3–1.2)
BUN: 8 mg/dL (ref 6–20)
CALCIUM: 9.1 mg/dL (ref 8.9–10.3)
CO2: 22 mmol/L (ref 22–32)
Chloride: 104 mmol/L (ref 101–111)
Creatinine, Ser: 0.71 mg/dL (ref 0.44–1.00)
Glucose, Bld: 121 mg/dL — ABNORMAL HIGH (ref 65–99)
Potassium: 3.2 mmol/L — ABNORMAL LOW (ref 3.5–5.1)
Sodium: 137 mmol/L (ref 135–145)
TOTAL PROTEIN: 7.3 g/dL (ref 6.5–8.1)

## 2018-03-28 LAB — CBC
HCT: 37.7 % (ref 35.0–47.0)
HEMOGLOBIN: 13.1 g/dL (ref 12.0–16.0)
MCH: 32.9 pg (ref 26.0–34.0)
MCHC: 34.8 g/dL (ref 32.0–36.0)
MCV: 94.6 fL (ref 80.0–100.0)
Platelets: 239 10*3/uL (ref 150–440)
RBC: 3.99 MIL/uL (ref 3.80–5.20)
RDW: 13.1 % (ref 11.5–14.5)
WBC: 6.6 10*3/uL (ref 3.6–11.0)

## 2018-03-28 LAB — URINALYSIS, COMPLETE (UACMP) WITH MICROSCOPIC
BILIRUBIN URINE: NEGATIVE
Glucose, UA: NEGATIVE mg/dL
KETONES UR: NEGATIVE mg/dL
Nitrite: NEGATIVE
PH: 7 (ref 5.0–8.0)
Protein, ur: NEGATIVE mg/dL
Specific Gravity, Urine: 1.017 (ref 1.005–1.030)
WBC, UA: >50 WBC/hpf — ABNORMAL HIGH (ref 0–5)

## 2018-03-28 LAB — WET PREP, GENITAL
CLUE CELLS WET PREP: NONE SEEN
Sperm: NONE SEEN
TRICH WET PREP: NONE SEEN
YEAST WET PREP: NONE SEEN

## 2018-03-28 LAB — CHLAMYDIA/NGC RT PCR (ARMC ONLY)
Chlamydia Tr: DETECTED — AB
N GONORRHOEAE: NOT DETECTED

## 2018-03-28 LAB — POCT PREGNANCY, URINE: PREG TEST UR: NEGATIVE

## 2018-03-28 MED ORDER — METRONIDAZOLE 500 MG PO TABS: 500.0000 mg | ORAL_TABLET | Freq: Two times a day (BID) | ORAL | 0 refills | Status: AC

## 2018-03-28 MED ORDER — SODIUM CHLORIDE 0.9 % IV SOLN
1.0000 g | Freq: Once | INTRAVENOUS | Status: AC
  Administered 2018-03-28: 1 g via INTRAVENOUS
  Filled 2018-03-28: qty 10

## 2018-03-28 MED ORDER — POTASSIUM CHLORIDE CRYS ER 20 MEQ PO TBCR
40.0000 meq | EXTENDED_RELEASE_TABLET | Freq: Once | ORAL | Status: AC
  Administered 2018-03-28: 40 meq via ORAL
  Filled 2018-03-28: qty 2

## 2018-03-28 MED ORDER — SODIUM CHLORIDE 0.9 % IV BOLUS
1000.0000 mL | Freq: Once | INTRAVENOUS | Status: AC
  Administered 2018-03-28: 1000 mL via INTRAVENOUS

## 2018-03-28 MED ORDER — IBUPROFEN 600 MG PO TABS
600.0000 mg | ORAL_TABLET | ORAL | Status: AC
  Administered 2018-03-28: 600 mg via ORAL
  Filled 2018-03-28: qty 1

## 2018-03-28 MED ORDER — DOXYCYCLINE HYCLATE 100 MG PO CAPS: 100.0000 mg | ORAL_CAPSULE | Freq: Two times a day (BID) | ORAL | 0 refills | Status: AC

## 2018-03-28 NOTE — ED Triage Notes (Signed)
Says she was at friends work and had episode where everything went back.  Her friend held her up.  She tried to walk her to car, but she was passing out again.  Thinks it was a seizure because her friend saw her eyes roll back and he body was shaking.

## 2018-03-28 NOTE — Discharge Instructions (Addendum)
These take the entire course of antibiotics, even if you are feeling better.  Drink plenty of fluids stay well-hydrated.  Please make a follow-up appointment with your primary care physician and gynecologist.  Please let all of your sexual partners know that you have tested positive for chlamydia, and do not resume sexual relations until your partners have been tested and treated as well.  Here, we do not test for HIV, but if you have chlamydia you are also at risk for developing HIV.  Please ask your doctors or the public health department to test you for HIV.  Return to the emergency department if you develop severe pain, fever, inability to keep down fluids, or any other symptoms concerning to you.

## 2018-03-28 NOTE — ED Provider Notes (Signed)
[facility] Emergency Department Provider Note   ____________________________________________   First MD Initiated Contact with Patient 03/28/18 [phone]     (approximate)  I have reviewed the triage vital signs and the nursing notes.   HISTORY  Chief Complaint Passed out   HPI Virginia Hicks is a 22 y.o. female presents for evaluation of episode of "passing out" yesterday  She is been feeling okay today, but she reports that yesterday she was standing with a friend at her friend's place of work when she started feeling very lightheaded.  She then started to feel faint, her friend grabbed her and stopped her from falling.  She then recovered after a few minutes time, got up and as she was walking through the parking lot she again got lightheaded and came close to passing out.  She reports she passed out, and her friend said she saw her eyes rolled back and back of her head and she shook just for a second or 2.  She then came to.  She reports this happens to her regular which she will start feeling lightheaded while at work, been ongoing for many months time.  But she is she reports she is never actually passed out but she is felt as though she could  No nausea vomiting.  No headache.  No fevers or chills.  She has been expensing discomfort in her lower abdomen and some vaginal discharge which she describes as somewhat abnormal and during the for about 2 weeks.  She was last sexually active about 2 months ago with unprotected intercourse.  Does not believe she is pregnant today.  Denies abdominal pain except for discomfort in her lower abdomen is when she stands and walks for about 2 weeks.  No shortness of breath.  No long trips or travel.  No chest pain or palpitations.  No leg swelling.  History reviewed. No pertinent past medical history.  There are no active problems to display for this patient.   History reviewed. No pertinent surgical history.  Prior to  Admission medications   Medication Sig Start Date End Date Taking? Authorizing Provider  dicyclomine (BENTYL) 10 MG capsule Take 1 capsule (10 mg total) by mouth 4 (four) times daily -  before meals and at bedtime. 02/16/17   Wyline Mood, MD  doxycycline (VIBRAMYCIN) 100 MG capsule Take 1 capsule (100 mg total) by mouth 2 (two) times daily. 03/28/18   Sharyn Creamer, MD  metroNIDAZOLE (FLAGYL) 500 MG tablet Take 1 tablet (500 mg total) by mouth 2 (two) times daily. 03/28/18   Sharyn Creamer, MD  pantoprazole (PROTONIX) 40 MG tablet Take 1 tablet (40 mg total) by mouth daily. 02/16/17 02/16/18  Darci Current, MD    Allergies Patient has no known allergies.  No family history on file.  Social History Social History   Tobacco Use  . Smoking status: Never Smoker  . Smokeless tobacco: Never Used  . Tobacco comment: vapors  Substance Use Topics  . Alcohol use: Yes    Comment: occasional  . Drug use: Yes    Types: Marijuana    Review of Systems Constitutional: No fever/chills Eyes: No visual changes. ENT: No sore throat. Cardiovascular: Denies chest pain. Respiratory: Denies shortness of breath. Gastrointestinal: No nausea, no vomiting.  No diarrhea.  No constipation. Genitourinary: Negative for dysuria. Musculoskeletal: Negative for back pain. Skin: Negative for rash. Neurological: Negative for headaches, focal weakness or numbness.    ____________________________________________   PHYSICAL EXAM:  VITAL  SIGNS: ED Triage Vitals  Enc Vitals Group     BP 03/28/18 1516 110/74     Pulse Rate 03/28/18 1516 78     Resp 03/28/18 1516 14     Temp 03/28/18 1516 98.2 F (36.8 C)     Temp Source 03/28/18 1516 Oral     SpO2 03/28/18 1516 100 %     Weight 03/28/18 1517 115 lb (52.2 kg)     Height 03/28/18 1517  (1.727 m)     Head Circumference --      Peak Flow --      Pain Score 03/28/18 1517 0     Pain Loc --      Pain Edu? --      Excl. in GC? --     Constitutional:  Alert and oriented. Well appearing and in no acute distress.  Very pleasant. Eyes: Conjunctivae are normal. Head: Atraumatic. Nose: No congestion/rhinnorhea. Mouth/Throat: Mucous membranes are moist. Neck: No stridor.   Cardiovascular: Normal rate, regular rhythm. Grossly normal heart sounds.  Good peripheral circulation. Respiratory: Normal respiratory effort.  No retractions. Lungs CTAB. Gastrointestinal: Soft and nontender there is some mild discomfort suprapubically without rebound or guarding.  No focal pain to McBurney's point.. No distention. Pelvic exam performed with nurse Lurena Joiner.  Patient has moderate cervical motion tenderness, she also has a somewhat thick almost fluorescing or greenish-yellow discharge from the cervix which is mild.  Denies adnexal tenderness.  No adnexal masses. Musculoskeletal: No lower extremity tenderness nor edema. Neurologic:  Normal speech and language. No gross focal neurologic deficits are appreciated.  Skin:  Skin is warm, dry and intact. No rash noted. Psychiatric: Mood and affect are normal. Speech and behavior are normal.  ____________________________________________   LABS (all labs ordered are listed, but only abnormal results are displayed)  Labs Reviewed  WET PREP, GENITAL - Abnormal; Notable for the following components:      Result Value   WBC, Wet Prep HPF POC FEW (*)    All other components within normal limits  COMPREHENSIVE METABOLIC PANEL - Abnormal; Notable for the following components:   Potassium 3.2 (*)    Glucose, Bld 121 (*)    Total Bilirubin 2.1 (*)    All other components within normal limits  URINALYSIS, COMPLETE (UACMP) WITH MICROSCOPIC - Abnormal; Notable for the following components:   Color, Urine YELLOW (*)    APPearance HAZY (*)    Hgb urine dipstick LARGE (*)    Leukocytes, UA LARGE (*)    WBC, UA >50 (*)    Bacteria, UA RARE (*)    All other components within normal limits  CHLAMYDIA/NGC RT PCR ([facility]  ONLY)  CBC  POC URINE PREG, ED  POCT PREGNANCY, URINE   ____________________________________________  EKG  ED ECG REPORT I, Sharyn Creamer, the attending physician, personally viewed and interpreted this ECG.  Date: 03/28/2018 EKG Time: 1920 Rate: 70 Rhythm: normal sinus rhythm QRS Axis: normal Intervals: normal ST/T Wave abnormalities: normal Narrative Interpretation: no evidence of acute ischemia and no prolonged QT or Brugada.  ____________________________________________  RADIOLOGY  Pelvic ultrasound pending at signout ____________________________________________   PROCEDURES  Procedure(s) performed: None  Procedures  Critical Care performed: No  ____________________________________________   INITIAL IMPRESSION / ASSESSMENT AND PLAN / ED COURSE  Pertinent labs & imaging results that were available during my care of the patient were reviewed by me and considered in my medical decision making (see chart for details).  Patient presents for evaluation  of an episode of passing out yesterday.  From her description it does not sound as though she had a seizure, but rather it sounds like a syncopal episode.  She reports she gets frequent lightheadedness while standing at work and the symptoms yesterday were the same.  She has not passed out or felt lightheaded today.  No associated cardiac or pulmonary symptoms.  No neurologic symptoms reassuring exam.  Additionally she did experience an pelvic discomfort and slight discharge for about 2 weeks, this is greater than a month after unprotected sex.  Her clinical examination seem to suggest pelvic inflammatory disease with cervical motion tenderness notable.  Given her history, will obtain a transvaginal ultrasound to rule out tubo-ovarian abscess of my overall suspicion for this process is low, but I suspect she does in fact probably of pelvic inflammatory disease.  We will treat with Rocephin, doxycycline and Flagyl,  appears appropriate for outpatient follow-up of her ultrasound is reassuring.  No evidence of an acute cardiac condition.  Encourage patient to stay hydrated, moving on her feet while at work.  She is comfortable with that plan, understands need for follow-up treatment with antibiotics and careful return precautions.  In addition, Dr. Sharma Covert will follow up on pelvic ultrasound results to rule out acute pathology/TOA.      ____________________________________________   FINAL CLINICAL IMPRESSION(S) / ED DIAGNOSES  Final diagnoses:  Syncope and collapse  Pelvic inflammatory disease (PID)      NEW MEDICATIONS STARTED DURING THIS VISIT:  New Prescriptions   DOXYCYCLINE (VIBRAMYCIN) 100 MG CAPSULE    Take 1 capsule (100 mg total) by mouth 2 (two) times daily.   METRONIDAZOLE (FLAGYL) 500 MG TABLET    Take 1 tablet (500 mg total) by mouth 2 (two) times daily.     Note:  This document was prepared using Dragon voice recognition software and may include unintentional dictation errors.     Sharyn Creamer, MD 03/28/18 2100

## 2018-03-28 NOTE — ED Triage Notes (Signed)
jj

## 2018-03-30 LAB — URINE CULTURE
CULTURE: NO GROWTH
Special Requests: NORMAL

## 2018-04-15 ENCOUNTER — Emergency Department (HOSPITAL_COMMUNITY)
Admission: EM | Admit: 2018-04-15 | Discharge: 2018-04-16 | Disposition: A | Discharge status: Home / Self Care | Payer: Managed Care, Other (non HMO) | Attending: Emergency Medicine | Admitting: Emergency Medicine

## 2018-04-15 ENCOUNTER — Encounter (HOSPITAL_COMMUNITY): Payer: Self-pay | Admitting: Emergency Medicine

## 2018-04-15 DIAGNOSIS — Z79899 Other long term (current) drug therapy: Secondary | ICD-10-CM | POA: Diagnosis not present

## 2018-04-15 DIAGNOSIS — T7840XA Allergy, unspecified, initial encounter: Secondary | ICD-10-CM | POA: Diagnosis present

## 2018-04-15 DIAGNOSIS — R21 Rash and other nonspecific skin eruption: Secondary | ICD-10-CM | POA: Insufficient documentation

## 2018-04-15 MED ORDER — TRIAMCINOLONE ACETONIDE 0.1 % EX CREA: 1.0000 "application " | TOPICAL_CREAM | Freq: Two times a day (BID) | CUTANEOUS | 0 refills | Status: AC

## 2018-04-15 MED ORDER — HYDROXYZINE HCL 25 MG PO TABS: 25.0000 mg | ORAL_TABLET | Freq: Three times a day (TID) | ORAL | 0 refills | Status: AC | PRN

## 2018-04-15 NOTE — ED Triage Notes (Signed)
Reports itching for several weeks but noticed a rash to both arms, back, and to right ankle.  Rash to ankle looks different than the rash on the rest of the body.  Took allergy medicine at 5pm.  Reports no change.

## 2018-04-15 NOTE — Discharge Instructions (Signed)
Use hydroxyzine as needed for itching. Use Kenalog cream on your ankle.  Do not use on your face or genitals, as this can cause skin discoloration. Follow-up with dermatology if your symptoms are not improving. It is important that you establish primary care to discuss your symptoms. You may contact the [place] and wellness clinic, or you may contact the [phone] number to help you set up primary care.  Return to the emergency room if you develop fevers, difficulty breathing, involvement of the lips, or any new or concerning symptoms.

## 2018-04-15 NOTE — ED Provider Notes (Signed)
[facility] Provider Note   CSN: [id number] Arrival date & time: 04/15/18  2136     History   Chief Complaint Chief Complaint  Patient presents with  . Allergic Reaction    HPI Virginia Hicks is a 22 y.o. female presenting for evaluation of rash.  Patient states for the past several weeks to a month, she has noticed a rash on her right ankle.  Last night, she noticed a rash that appears different on her bilateral arms and torso.  She denies new environments, exposures, detergents, soaps.  She denies contacts with similar rash.  She denies history of similar.  She states the rash itches, but it does not hurt.  No drainage.  She denies fevers, chills, difficulty breathing, chest pain, shortness breath, nausea, vomiting.  She takes no medications daily, has no medical problems.  She was put on antibiotics for chlamydia, this was started after her rash and ended several days ago.  She has not tried anything for her rash.  Nothing makes it better.   HPI  History reviewed. No pertinent past medical history.  There are no active problems to display for this patient.   History reviewed. No pertinent surgical history.   OB History   None      Home Medications    Prior to Admission medications   Medication Sig Start Date End Date Taking? Authorizing Provider  dicyclomine (BENTYL) 10 MG capsule Take 1 capsule (10 mg total) by mouth 4 (four) times daily -  before meals and at bedtime. Patient not taking: Reported on 03/28/2018 02/16/17   Wyline MoodAnna, Kiran, MD  doxycycline (VIBRAMYCIN) 100 MG capsule Take 1 capsule (100 mg total) by mouth 2 (two) times daily. 03/28/18   Sharyn CreamerQuale, Mark, MD  fexofenadine (ALLEGRA) 60 MG tablet Take 60 mg by mouth daily as needed for allergies or rhinitis.    [provider]  hydrOXYzine (ATARAX/VISTARIL) 25 MG tablet Take 1 tablet (25 mg total) by mouth every 8 (eight) hours as needed. 04/15/18   Siddhanth Denk,  PA-C  metroNIDAZOLE (FLAGYL) 500 MG tablet Take 1 tablet (500 mg total) by mouth 2 (two) times daily. 03/28/18   Sharyn CreamerQuale, Mark, MD  omeprazole (PRILOSEC) 20 MG capsule Take 20 mg by mouth daily as needed (heart burn).    [provider]  pantoprazole (PROTONIX) 40 MG tablet Take 1 tablet (40 mg total) by mouth daily. Patient not taking: Reported on 03/28/2018 02/16/17 02/16/18  Darci CurrentBrown, Vance N, MD  triamcinolone cream (KENALOG) 0.1 % Apply 1 application topically 2 (two) times daily. 04/15/18   Amani Marseille, PA-C    Family History No family history on file.  Social History Social History   Tobacco Use  . Smoking status: Never Smoker  . Smokeless tobacco: Never Used  . Tobacco comment: vapors  Substance Use Topics  . Alcohol use: Yes    Comment: occasional  . Drug use: Yes    Types: Marijuana     Allergies   Patient has no known allergies.   Review of Systems Review of Systems  Skin: Positive for rash.  All other systems reviewed and are negative.    Physical Exam Updated Vital Signs BP 108/64 (BP Location: Right Arm)   Pulse 69   Temp 98.1 F (36.7 C) (Oral)   Resp 18   SpO2 99%   Physical Exam  Constitutional: She is oriented to person, place, and time. She appears well-developed and well-nourished. No distress.  Appears in  no distress  HENT:  Head: Normocephalic and atraumatic.  No mucous membrane involvement.  Eyes: Pupils are equal, round, and reactive to light. Conjunctivae and EOM are normal.  Neck: Normal range of motion. Neck supple.  Cardiovascular: Normal rate, regular rhythm and intact distal pulses.  Pulmonary/Chest: Effort normal and breath sounds normal. No respiratory distress. She has no wheezes.  Clear lung sounds in all fields.  No respiratory distress.  Abdominal: Soft. Bowel sounds are normal. She exhibits no distension. There is no tenderness.  Musculoskeletal: Normal range of motion.  Neurological: She is alert and oriented to  person, place, and time.  Skin: Skin is warm and dry. Rash noted.  Small discrete macular rash noted along bilateral extremities and back.  Appears consistent with flea bites.  No surrounding erythema, purulence, or drainage.  No involvement of the palms or soles.  No burrowing or lesions between the fingers.  3 discrete lesions on the right ankle which are plaque-like and dry.  Central scabbing.  No surrounding erythema or drainage.  No tenderness.  Psychiatric: She has a normal mood and affect.  Nursing note and vitals reviewed.    ED Treatments / Results  Labs (all labs ordered are listed, but only abnormal results are displayed) Labs Reviewed - No data to display  EKG None  Radiology No results found.  Procedures Procedures (including critical care time)  Medications Ordered in ED Medications - No data to display   Initial Impression / Assessment and Plan / ED Course  I have reviewed the triage vital signs and the nursing notes.  Pertinent labs & imaging results that were available during my care of the patient were reviewed by me and considered in my medical decision making (see chart for details).     Patient presenting for evaluation of rash.  Physical exam reassuring, she is afebrile not tachycardic.  She appears nontoxic.  No fevers, chills, or mucous membrane involvement.  Upper extremity and back rash appears consistent with fleabites or dermatitis.  Ankle rash appears different.  Will treat symptomatically with hydroxyzine for itching and Kenalog on ankle rash.  Doubt SJS, TPN, RMSF, or syphilis rash.  Discussed with patient.  Discussed follow-up with dermatology if symptoms are not improving.  Patient encouraged to establish care with PCP.  At this time, patient appears safe for discharge.  Return precautions given.  Patient states she understands and agrees plan.  Final Clinical Impressions(s) / ED Diagnoses   Final diagnoses:  Rash    ED Discharge Orders         Ordered    hydrOXYzine (ATARAX/VISTARIL) 25 MG tablet  Every 8 hours PRN     04/15/18 2322    triamcinolone cream (KENALOG) 0.1 %  2 times daily     04/15/18 2322       Alveria Apley, PA-C 04/16/18 0116    Terrilee Files, MD 04/17/18 1135

## 2018-06-26 ENCOUNTER — Other Ambulatory Visit: Payer: Self-pay | Admitting: *Deleted

## 2018-06-26 NOTE — Patient Outreach (Signed)
[facility]) Care Management  06/26/2018  Virginia JunCarolyn Glotfelty 12-01-1995 [id number]   Subjective: Telephone call to patient's home number, spoke with patient, HIPAA verified, and call dropped.    Telephone call to patient's home  number, no answer, left HIPAA compliant voicemail message, and requested call back.     Objective: Per KPN (Knowledge Performance Now, point of care tool) and chart review, patient has had no recent hospitalization.   Patient had 2 ED visits within the last year on the following dates:  04/15/18 for allergic reaction and 03/28/18 for syncope, Pelvic inflammatory disease (PID).   Patient has a history of marijuana and vape use.      Assessment: [facility] on  06/23/18.   [facility] pending patient contact.      Plan: RNCM will send unsuccessful outreach  letter, [facility], will call patient for 2nd telephone outreach attempt, [facility], and proceed with case closure, within 10 business days if no return call.      Amatullah Christy H. Gardiner Barefootooper RN, BSN, CCM [facility] Telephonic CM Phone: [phone] Fax: [phone]

## 2018-06-27 ENCOUNTER — Other Ambulatory Visit: Payer: Managed Care, Other (non HMO) | Admitting: *Deleted

## 2018-06-27 NOTE — Patient Outreach (Signed)
[facility]) Care Management  06/27/2018  Virginia JunCarolyn Hoelzel 1996-06-13 [id number]   Subjective: Telephone call to patient's home  number, no answer, left HIPAA compliant voicemail message, and requested call back.     Objective: Per KPN (Knowledge Performance Now, point of care tool) and chart review, patient has had no recent hospitalization.   Patient had 2 ED visits within the last year on the following dates:  04/15/18 for allergic reaction and 03/28/18 for syncope, Pelvic inflammatory disease (PID).   Patient has a history of marijuana and vape use.      Assessment: [facility] on  06/23/18.   [facility] follow [facility] pending patient contact.      Plan: RNCM has sent unsuccessful outreach  letter, [facility] pamph[facility], will call patient for 3rd telephone outreach attempt, [facility] Consult follow up, and proceed with case closure, within 10 business days if no return call.     Virginia Halberstadt H. Gardiner Barefootooper RN, BSN, CCM [facility] Care [facility] Telephonic CM Phone: [phone] Fax: [phone]

## 2018-06-28 ENCOUNTER — Other Ambulatory Visit: Payer: Self-pay | Admitting: *Deleted

## 2018-06-28 NOTE — Patient Outreach (Signed)
[facility]) Care Management  06/28/2018  Scot JunCarolyn Whitefield August 24, 1996 [id number]   Subjective:Telephone call to patient's homenumber, no answer, left HIPAA compliant voicemail message, and requested call back.     Objective:Per KPN (Knowledge Performance Now, point of care tool) and chart review,patient has had no recent hospitalization. Patient had 2 ED visits within the last year on the following dates: 04/15/18 for allergic reaction and 03/28/18 for syncope, Pelvic inflammatory disease(PID). Patient has a history of marijuana and vape use.     Assessment: [facility] 06/[facility]/18. [facility] pending patient contact.     Plan:RNCM has sent unsuccessful outreach letter, [facility] pamphlet, and will proceed with case closure, within 10 business days if no return call.     Maytal Mijangos H. Gardiner Barefootooper RN, BSN, CCM [facility] Care Management [facility] Telephonic CM Phone: [phone] Fax: [phone]

## 2018-07-07 ENCOUNTER — Other Ambulatory Visit: Payer: Self-pay | Admitting: *Deleted

## 2018-07-07 NOTE — Patient Outreach (Signed)
[facility]) Care Management  07/07/2018  Virginia Hicks 10/14/1996 [id number]   No response from patient outreach attempts will proceed with case closure.    Objective:Per KPN (Knowledge Performance Now, point of care tool) and chart review,patient has had no recent hospitalization. Patient had 2 ED visits within the last year on the following dates: 04/15/18 for allergic reaction and 03/28/18 for syncope, Pelvic inflammatory disease(PID). Patient has a history of marijuana and vape use.     Assessment: Received [facility] Consult referral on 06/23/18. [facility] Consult follow up not completed patient unable to reach and will proceed with case closure.      Plan:Case closure due to unable to reach.    Virginia Hicks H. Gardiner Barefoot, BSN, CCM [facility] Care Management [facility] Telephonic CM Phone: [phone] Fax: [phone]

## 2019-04-07 IMAGING — CT CT ABD-PELV W/ CM
2 of 4 series · 16 of 46 positions shown, 18 images · IV contrast (APPLIED)
Comparison: None.

CLINICAL DATA: Upper abdominal, umbilical, and lower abdominal
pain. Patient was seen here on [REDACTED] for same and told to
return if pain still ongoing. Minimal response to pain medication.

EXAM:
CT ABDOMEN AND PELVIS WITH CONTRAST
TECHNIQUE: Multidetector CT imaging of the abdomen and pelvis was performed
using the standard protocol following bolus administration of
intravenous contrast.
CONTRAST:  75mL TWQKB7-RYY IOPAMIDOL (TWQKB7-RYY) INJECTION 61%

[Series 2: routine abd/pel with · axial · 0.58mm/px · z∈[-828,-458]mm · 13 of 82 slices shown, 15 images]
[im 4/82  soft-tissue]
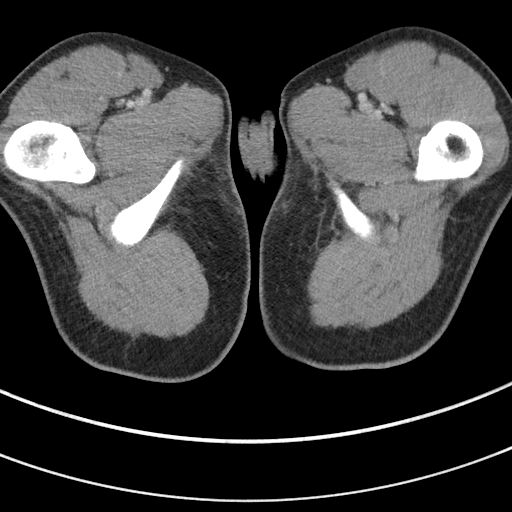
[im 4/82  bone]
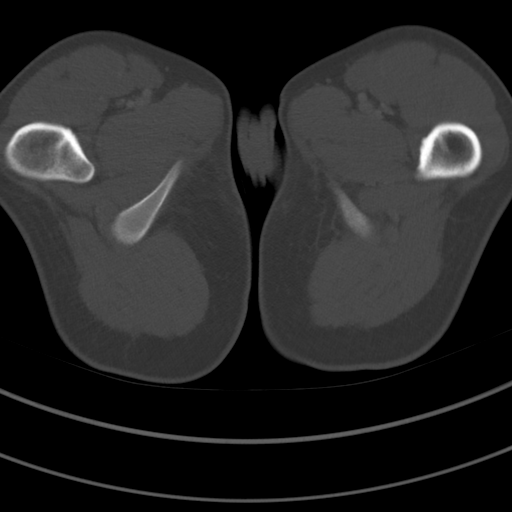
[im 11/82  soft-tissue]
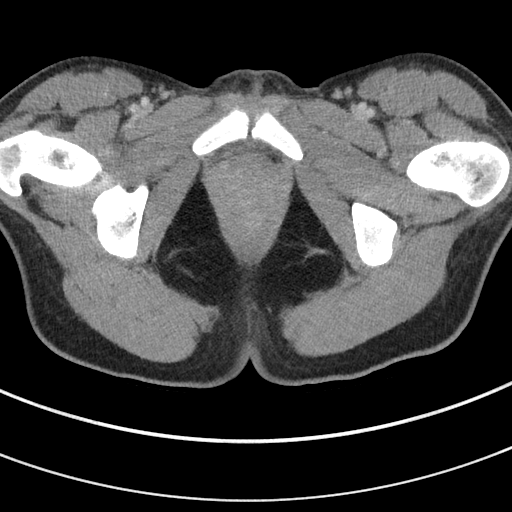
[im 17/82  soft-tissue]
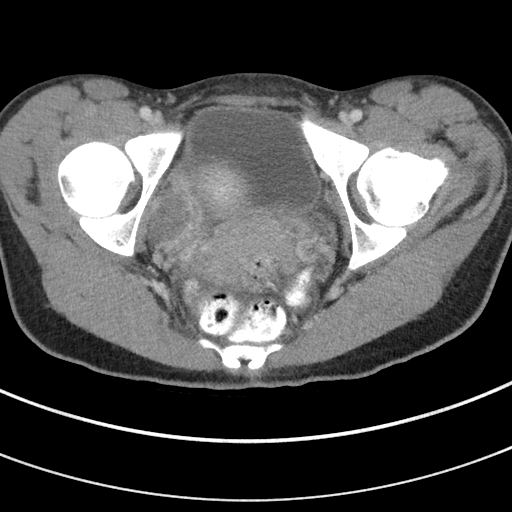
[im 24/82  soft-tissue]
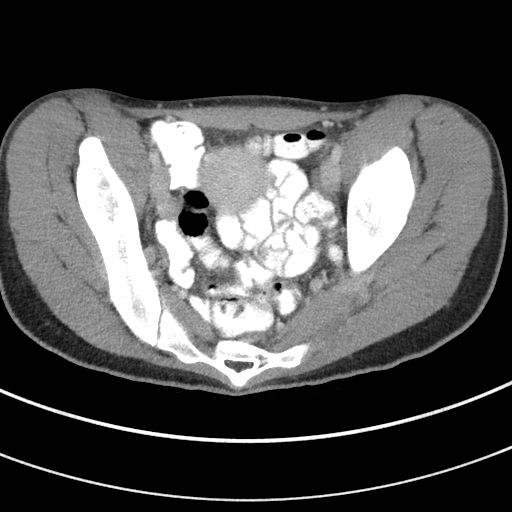
[im 28/82  soft-tissue]
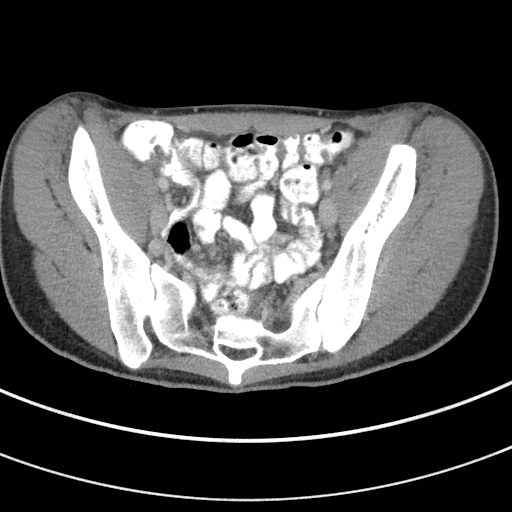
[im 34/82  soft-tissue]
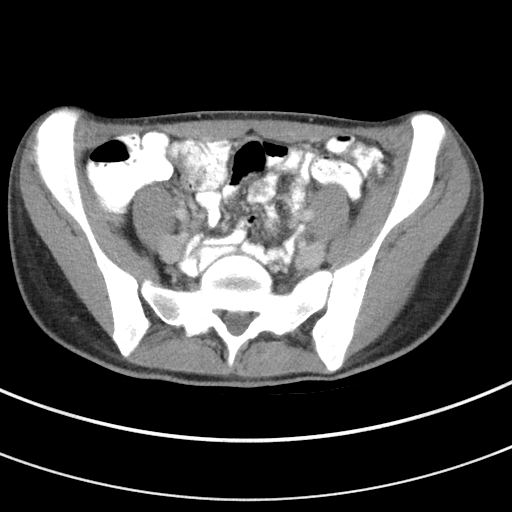
[im 41/82  soft-tissue]
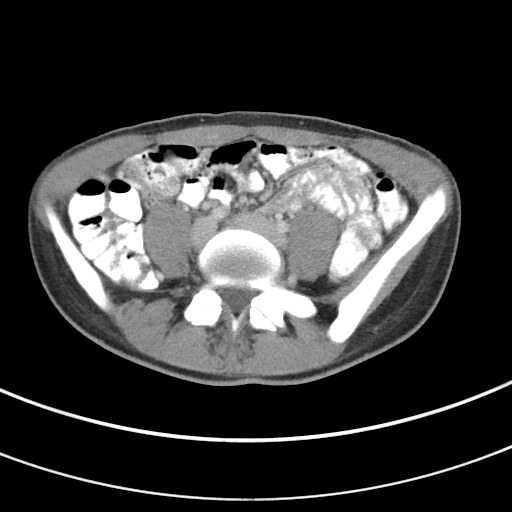
[im 48/82  soft-tissue]
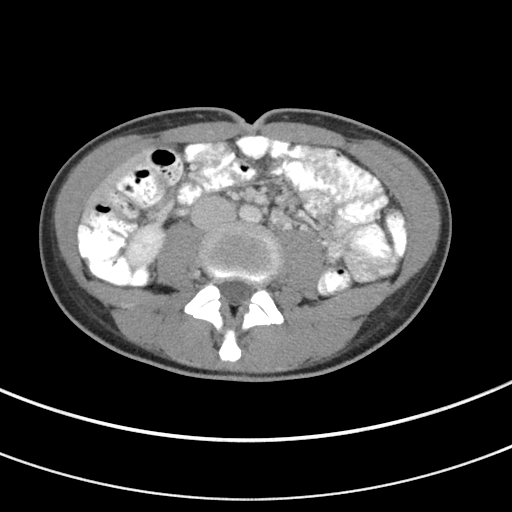
[im 55/82  soft-tissue]
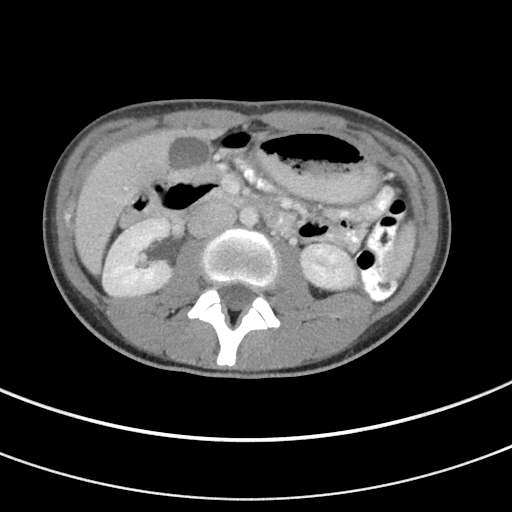
[im 55/82  bone]
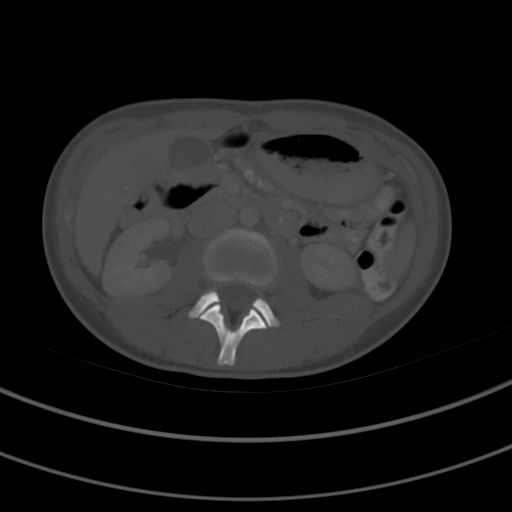
[im 58/82  soft-tissue]
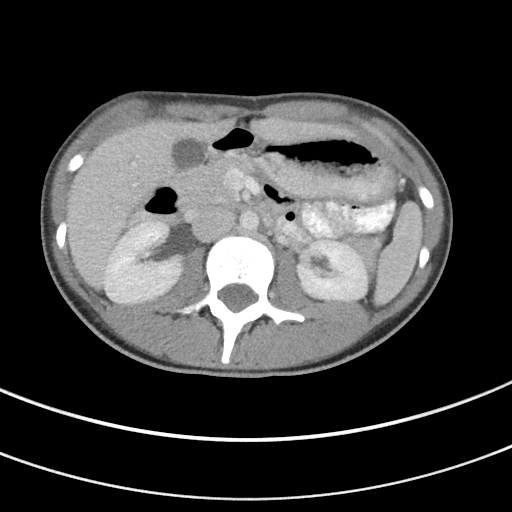
[im 65/82  soft-tissue]
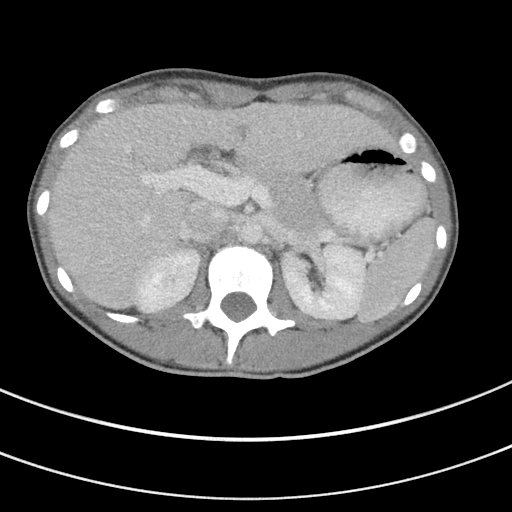
[im 71/82  soft-tissue]
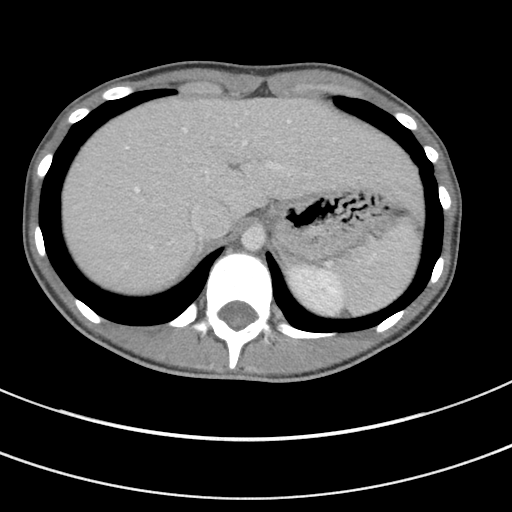
[im 78/82  soft-tissue]
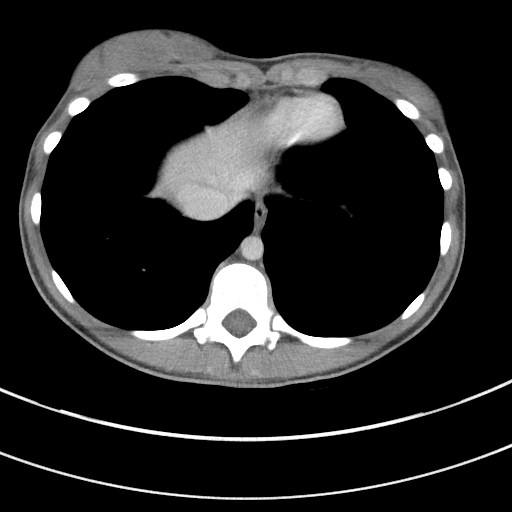

[Series 5: coronal st · coronal · 0.66mm/px · 3 of 65 slices shown]
[im 22/65  soft-tissue]
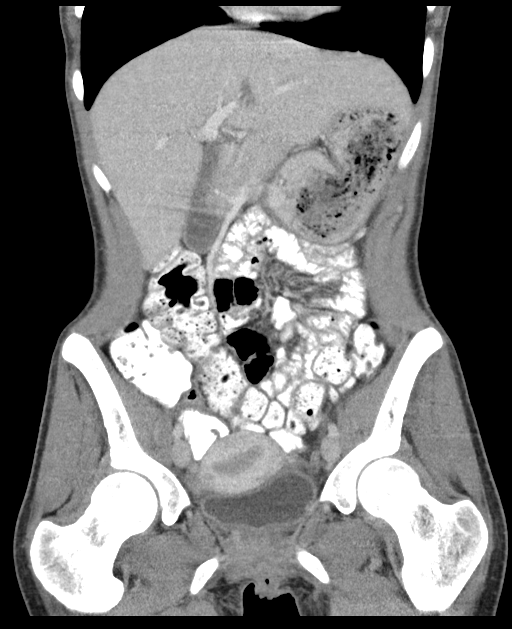
[im 29/65  soft-tissue]
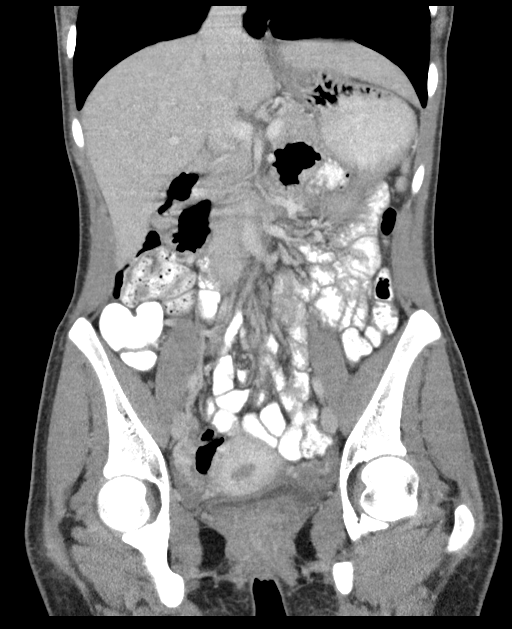
[im 36/65  soft-tissue]
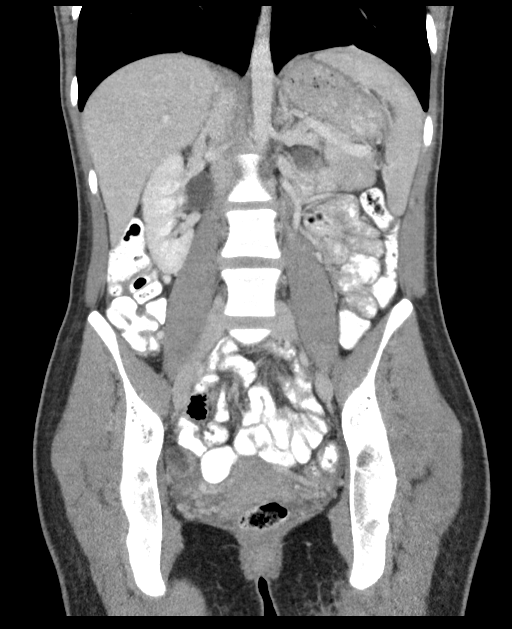

[16 of 46 positions shown; findings below may reference images not displayed]

FINDINGS: Lower chest: The lung bases are clear.

Hepatobiliary: No focal liver abnormality is seen. No gallstones,
gallbladder wall thickening, or biliary dilatation.

Pancreas: Unremarkable. No pancreatic ductal dilatation or
surrounding inflammatory changes.

Spleen: Normal in size without focal abnormality.

Adrenals/Urinary Tract: Adrenal glands are unremarkable. Kidneys are
normal, without renal calculi, focal lesion, or hydronephrosis.
Bladder is unremarkable.

Stomach/Bowel: Stomach is within normal limits. Appendix appears
normal. No evidence of bowel wall thickening, distention, or
inflammatory changes.

Vascular/Lymphatic: No significant vascular findings are present. No
enlarged abdominal or pelvic lymph nodes.

Reproductive: Uterus and bilateral adnexa are unremarkable.

Other: No abdominal wall hernia or abnormality. No abdominopelvic
ascites.

Musculoskeletal: No destructive bone lesions.
IMPRESSION: No acute process demonstrated in the abdomen or pelvis. No evidence
of bowel obstruction or inflammation.

## 2020-02-10 IMAGING — US US TRANSVAGINAL NON-OB
1 series · 14 of 25 positions shown · non-contrast
Comparison: None

CLINICAL DATA: Suspected PID. Evaluate for fluid collection or
abscess.

EXAM:
TRANSABDOMINAL AND TRANSVAGINAL ULTRASOUND OF PELVIS
TECHNIQUE: Both transabdominal and transvaginal ultrasound examinations of the
pelvis were performed. Transabdominal technique was performed for
global imaging of the pelvis including uterus, ovaries, adnexal
regions, and pelvic cul-de-sac. It was necessary to proceed with
endovaginal exam following the transabdominal exam to visualize the
endometrium and ovaries.

[Series 1: us transvaginal non-ob · 0.17mm/px · 14 of 88 slices shown]
[im 1/88]
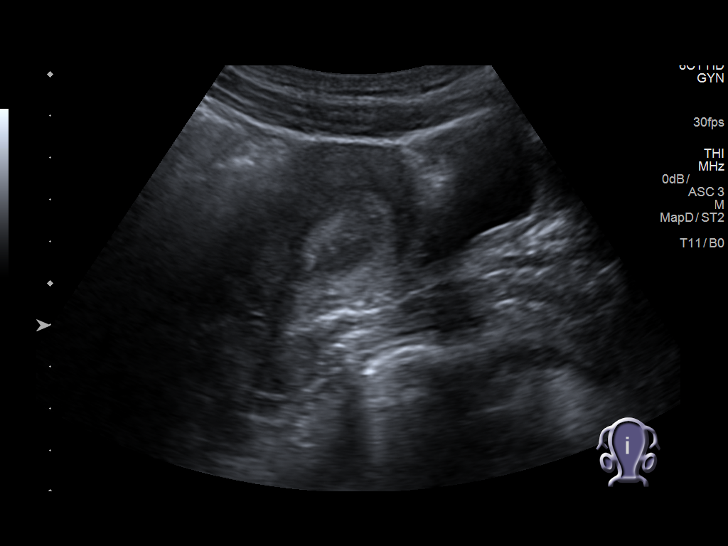
[im 8/88]
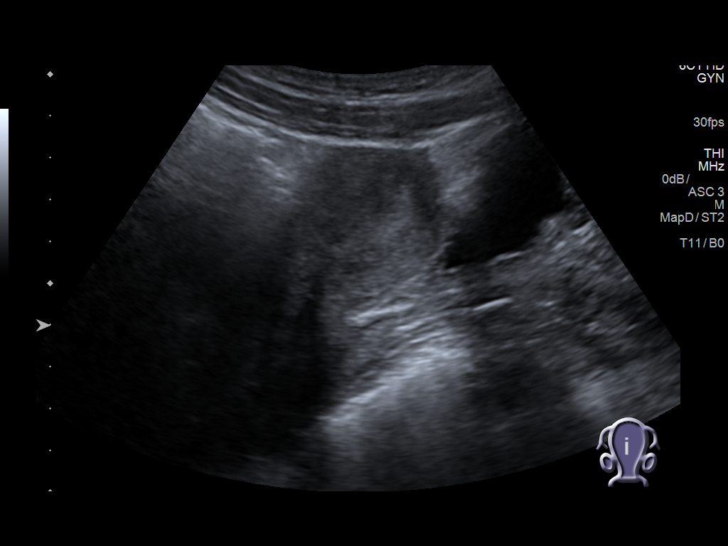
[im 15/88]
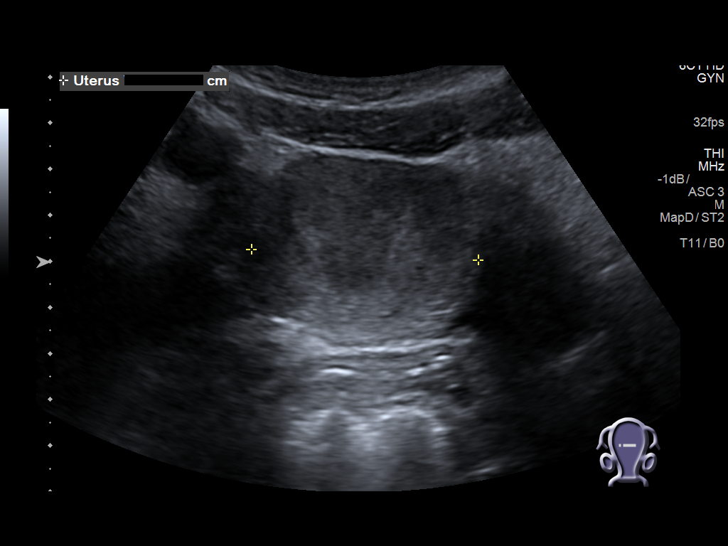
[im 22/88]
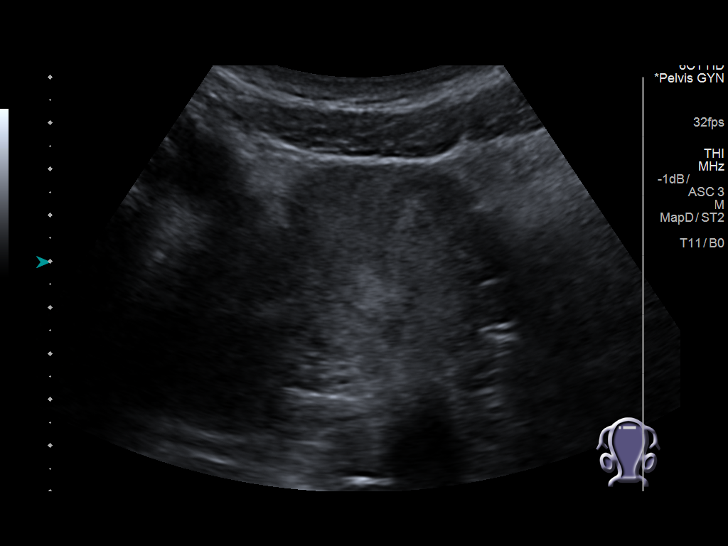
[im 30/88]
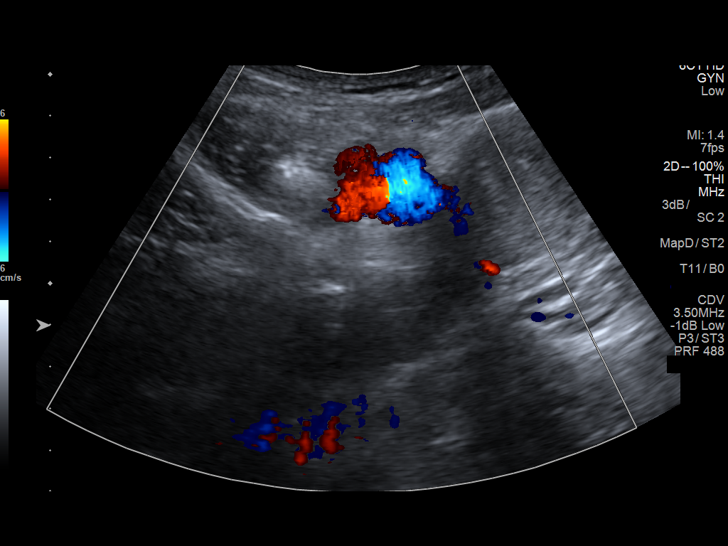
[im 33/88]
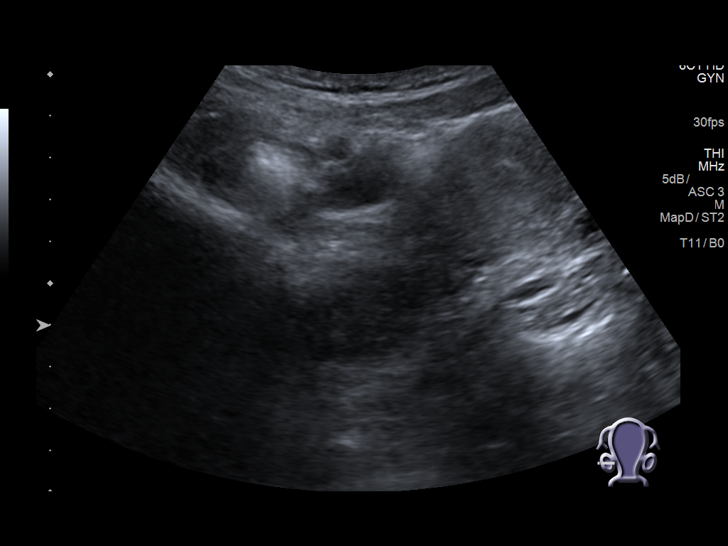
[im 40/88]
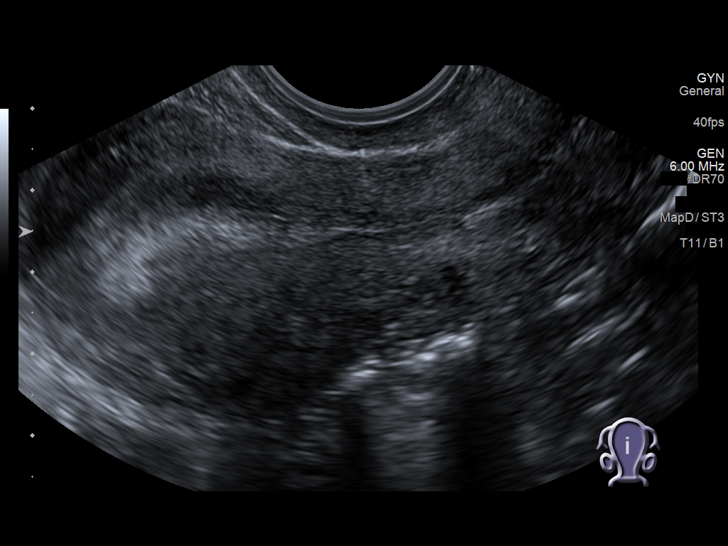
[im 48/88]
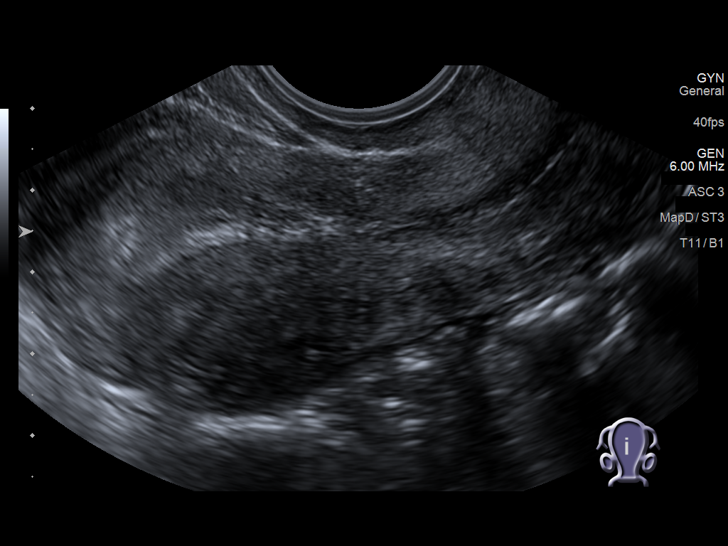
[im 55/88]
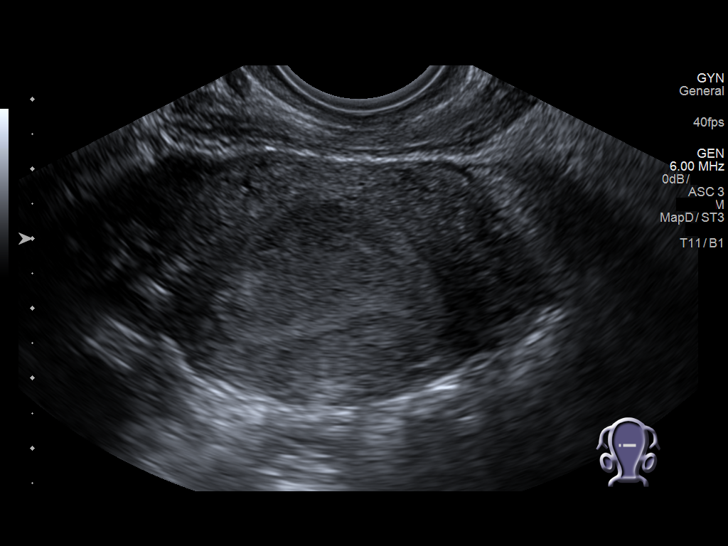
[im 59/88]
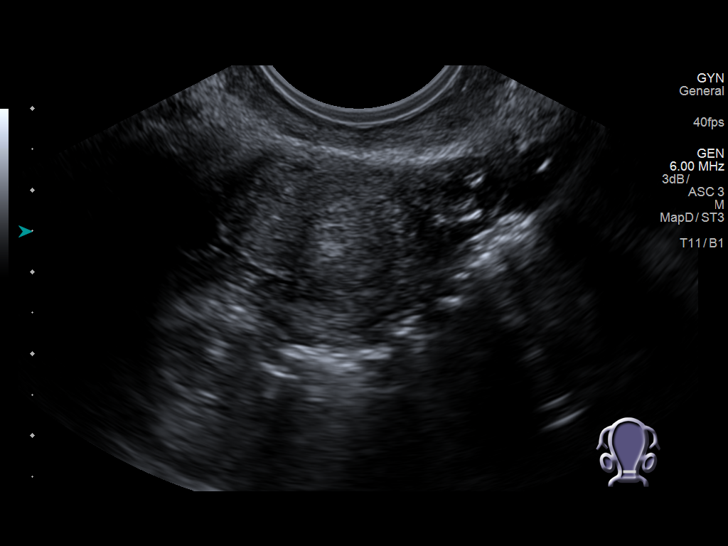
[im 66/88]
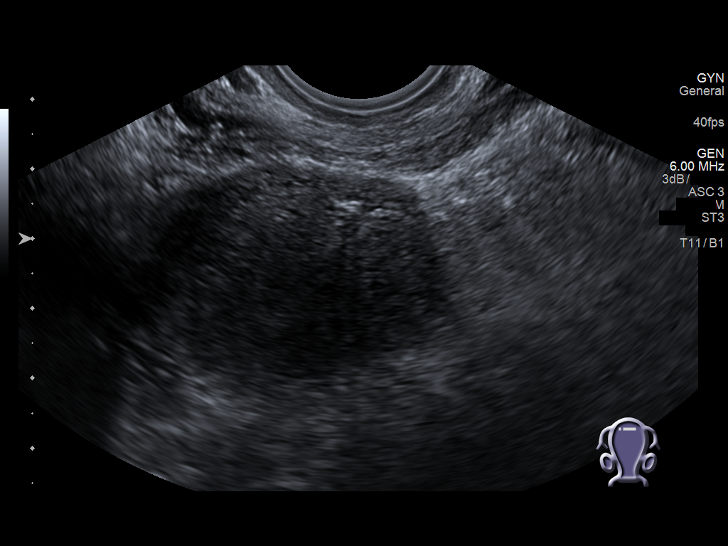
[im 73/88]
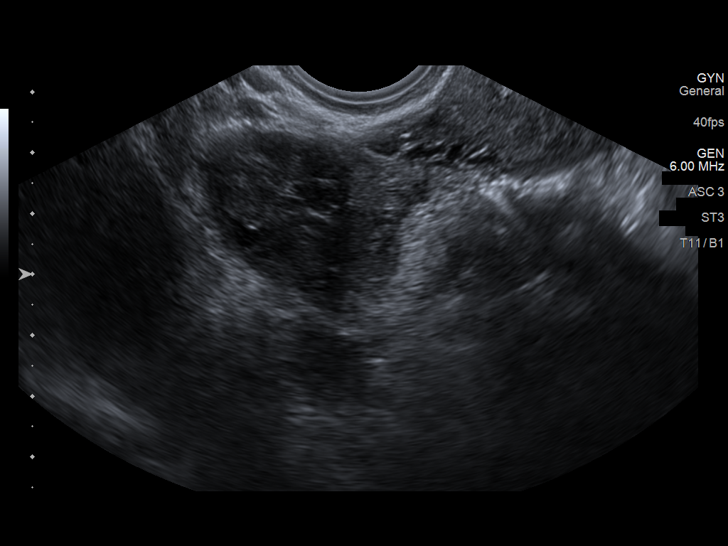
[im 80/88]
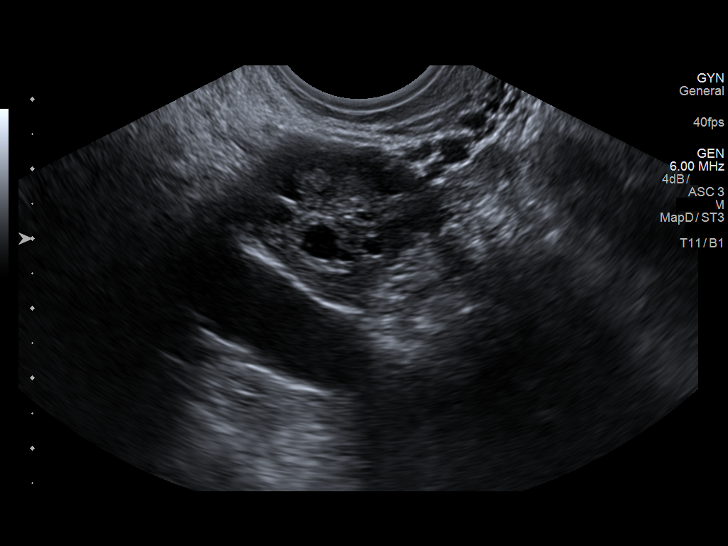
[im 88/88]
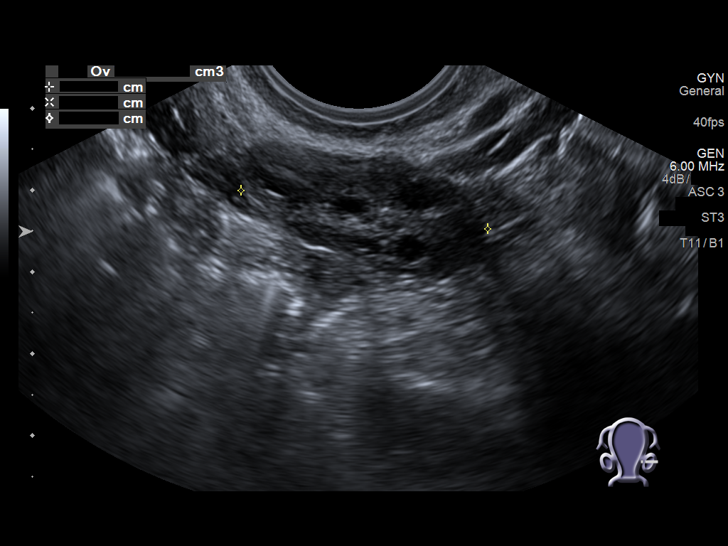

[14 of 25 positions shown; findings below may reference images not displayed]

FINDINGS: Uterus

Measurements: 6.6 x 3.4 x 4.9 cm. No fibroids or other mass
visualized.

Endometrium

Thickness: 13 mm.  No focal abnormality visualized.

Right ovary

Measurements: 4.9 x 2.5 x 3.4 cm. Normal appearance/no adnexal mass.

Left ovary

Measurements: 3.1 x 1.9 x 3.1 cm. Normal appearance/no adnexal mass.

Other findings

No abnormal free fluid.
IMPRESSION: 1. No abscess or fluid collection.  Normal study.

## 2020-10-05 ENCOUNTER — Other Ambulatory Visit: Payer: Self-pay

## 2020-10-05 ENCOUNTER — Ambulatory Visit
Admission: EM | Admit: 2020-10-05 | Discharge: 2020-10-05 | Disposition: A | Discharge status: Home / Self Care | Payer: BLUE CROSS/BLUE SHIELD | Attending: Family Medicine | Admitting: Family Medicine

## 2020-10-05 ENCOUNTER — Ambulatory Visit: Admit: 2020-10-05 | Discharge status: Absent without leave | Payer: Self-pay

## 2020-10-05 DIAGNOSIS — Z7689 Persons encountering health services in other specified circumstances: Secondary | ICD-10-CM

## 2020-10-05 NOTE — ED Triage Notes (Signed)
Patient states that she is here for a note to return to work. States that she works at [employer] and was told to quarantine at home after being exposed to someone who was positive. States that she needs a note being able to return to work. Patient with no symptoms during quarantine time. Was sent home from work on 11/22.

## 2020-10-05 NOTE — ED Provider Notes (Signed)
[facility]    CSN: [id number] Arrival date & time: 10/05/20  1205      History   Chief Complaint Chief Complaint  Patient presents with  . Covid Exposure  . Note for work   HPI  24 year old female presents with the above complaint.  Patient had a Covid exposure at work.  She was sent home from work on 11/22.  She was told to quarantine.  She has had no symptoms and is feeling well.  She needs a note so that she may return to work.  Home Medications    Prior to Admission medications   Medication Sig Start Date End Date Taking? Authorizing Provider  dicyclomine (BENTYL) 10 MG capsule Take 1 capsule (10 mg total) by mouth 4 (four) times daily -  before meals and at bedtime. Patient not taking: Reported on 03/28/2018 02/16/17   Wyline Mood, MD  doxycycline (VIBRAMYCIN) 100 MG capsule Take 1 capsule (100 mg total) by mouth 2 (two) times daily. 03/28/18   Sharyn Creamer, MD  fexofenadine (ALLEGRA) 60 MG tablet Take 60 mg by mouth daily as needed for allergies or rhinitis.    [provider]  hydrOXYzine (ATARAX/VISTARIL) 25 MG tablet Take 1 tablet (25 mg total) by mouth every 8 (eight) hours as needed. 04/15/18   Caccavale, Sophia, PA-C  metroNIDAZOLE (FLAGYL) 500 MG tablet Take 1 tablet (500 mg total) by mouth 2 (two) times daily. 03/28/18   Sharyn Creamer, MD  omeprazole (PRILOSEC) 20 MG capsule Take 20 mg by mouth daily as needed (heart burn).    [provider]  pantoprazole (PROTONIX) 40 MG tablet Take 1 tablet (40 mg total) by mouth daily. Patient not taking: Reported on 03/28/2018 02/16/17 02/16/18  Darci Current, MD  triamcinolone cream (KENALOG) 0.1 % Apply 1 application topically 2 (two) times daily. 04/15/18   Caccavale, Sophia, PA-C   Social History Social History   Tobacco Use  . Smoking status: Never Smoker  . Smokeless tobacco: Never Used  . Tobacco comment: vapors  Vaping Use  . Vaping Use: Some days  Substance Use Topics  . Alcohol use:  Yes    Comment: occasional  . Drug use: Yes    Types: Marijuana     Allergies   Patient has no known allergies.   Review of Systems Review of Systems  Constitutional: Negative.   HENT: Negative.   Respiratory: Negative.    Physical Exam Triage Vital Signs ED Triage Vitals  Enc Vitals Group     BP 10/05/20 1419 106/72     Pulse Rate 10/05/20 1419 98     Resp 10/05/20 1419 18     Temp 10/05/20 1419 98.5 F (36.9 C)     Temp Source 10/05/20 1419 Oral     SpO2 10/05/20 1419 100 %     Weight 10/05/20 1417 113 lb (51.3 kg)     Height 10/05/20 1417 5\' 10"  (1.778 m)     Head Circumference --      Peak Flow --      Pain Score 10/05/20 1417 0     Pain Loc --      Pain Edu? --      Excl. in GC? --    Updated Vital Signs BP 106/72 (BP Location: Left Arm)   Pulse 98   Temp 98.5 F (36.9 C) (Oral)   Resp 18   Ht 5\' 10"  (1.778 m)   Wt 51.3 kg   LMP 09/18/2020  SpO2 100%   BMI 16.21 kg/m   Visual Acuity Right Eye Distance:   Left Eye Distance:   Bilateral Distance:    Right Eye Near:   Left Eye Near:    Bilateral Near:     Physical Exam Vitals and nursing note reviewed.  Constitutional:      General: She is not in acute distress.    Appearance: Normal appearance. She is not ill-appearing.  HENT:     Head: Normocephalic and atraumatic.  Cardiovascular:     Rate and Rhythm: Normal rate and regular rhythm.     Heart sounds: No murmur heard.   Pulmonary:     Effort: Pulmonary effort is normal.     Breath sounds: Normal breath sounds. No wheezing, rhonchi or rales.  Neurological:     Mental Status: She is alert.  Psychiatric:        Mood and Affect: Mood normal.        Behavior: Behavior normal.    UC Treatments / Results  Labs (all labs ordered are listed, but only abnormal results are displayed) Labs Reviewed - No data to display  EKG   Radiology No results found.  Procedures Procedures (including critical care time)  Medications Ordered in  UC Medications - No data to display  Initial Impression / Assessment and Plan / UC Course  I have reviewed the triage vital signs and the nursing notes.  Pertinent labs & imaging results that were available during my care of the patient were reviewed by me and considered in my medical decision making (see chart for details).    24 year old female presents with a return to work evaluation.  She is complete with her quarantine and is feeling well.  She has no symptoms.  She may return to work.  Work note given.  Final Clinical Impressions(s) / UC Diagnoses   Final diagnoses:  Return to work evaluation   Discharge Instructions   None    ED Prescriptions    None     PDMP not reviewed this encounter.   Tommie Sams, [place] 10/05/20 1738
